# Patient Record
Sex: Female | Born: 1935 | Race: White | Hispanic: No | Smoking: Former smoker
Health system: Southern US, Community
[De-identification: ages and names within clinical notes are randomized; demographics above are authoritative.]

## PROBLEM LIST (undated history)

## (undated) DIAGNOSIS — F419 Anxiety disorder, unspecified: Secondary | ICD-10-CM

## (undated) DIAGNOSIS — J449 Chronic obstructive pulmonary disease, unspecified: Secondary | ICD-10-CM

## (undated) DIAGNOSIS — M810 Age-related osteoporosis without current pathological fracture: Secondary | ICD-10-CM

## (undated) DIAGNOSIS — I10 Essential (primary) hypertension: Secondary | ICD-10-CM

## (undated) DIAGNOSIS — M1990 Unspecified osteoarthritis, unspecified site: Secondary | ICD-10-CM

## (undated) HISTORY — PX: REFRACTIVE SURGERY: SHX103

---

## 2006-10-17 ENCOUNTER — Emergency Department: Payer: Self-pay | Admitting: Emergency Medicine

## 2006-10-17 ENCOUNTER — Other Ambulatory Visit: Payer: Self-pay

## 2009-06-11 ENCOUNTER — Inpatient Hospital Stay: Payer: Self-pay | Admitting: Internal Medicine

## 2012-10-13 ENCOUNTER — Ambulatory Visit: Payer: Self-pay | Admitting: Ophthalmology

## 2016-01-21 ENCOUNTER — Emergency Department: Payer: Medicare Other

## 2016-01-21 ENCOUNTER — Encounter: Payer: Self-pay | Admitting: Emergency Medicine

## 2016-01-21 ENCOUNTER — Inpatient Hospital Stay
Admission: EM | Admit: 2016-01-21 | Discharge: 2016-01-25 | DRG: 871 | Disposition: A | Discharge status: Hospice | Payer: Medicare Other | Attending: Specialist | Admitting: Specialist

## 2016-01-21 ENCOUNTER — Emergency Department
Admission: EM | Admit: 2016-01-21 | Discharge: 2016-01-21 | Disposition: A | Discharge status: Home / Self Care | Payer: Self-pay

## 2016-01-21 DIAGNOSIS — A419 Sepsis, unspecified organism: Secondary | ICD-10-CM | POA: Diagnosis present

## 2016-01-21 DIAGNOSIS — I10 Essential (primary) hypertension: Secondary | ICD-10-CM | POA: Diagnosis present

## 2016-01-21 DIAGNOSIS — F419 Anxiety disorder, unspecified: Secondary | ICD-10-CM | POA: Diagnosis present

## 2016-01-21 DIAGNOSIS — N179 Acute kidney failure, unspecified: Secondary | ICD-10-CM | POA: Diagnosis present

## 2016-01-21 DIAGNOSIS — G9341 Metabolic encephalopathy: Secondary | ICD-10-CM | POA: Diagnosis present

## 2016-01-21 DIAGNOSIS — Z515 Encounter for palliative care: Secondary | ICD-10-CM | POA: Diagnosis not present

## 2016-01-21 DIAGNOSIS — Z66 Do not resuscitate: Secondary | ICD-10-CM | POA: Diagnosis present

## 2016-01-21 DIAGNOSIS — A4151 Sepsis due to Escherichia coli [E. coli]: Secondary | ICD-10-CM | POA: Diagnosis not present

## 2016-01-21 DIAGNOSIS — J440 Chronic obstructive pulmonary disease with acute lower respiratory infection: Secondary | ICD-10-CM | POA: Diagnosis present

## 2016-01-21 DIAGNOSIS — I959 Hypotension, unspecified: Secondary | ICD-10-CM | POA: Diagnosis present

## 2016-01-21 DIAGNOSIS — B9620 Unspecified Escherichia coli [E. coli] as the cause of diseases classified elsewhere: Secondary | ICD-10-CM | POA: Diagnosis present

## 2016-01-21 DIAGNOSIS — N184 Chronic kidney disease, stage 4 (severe): Secondary | ICD-10-CM | POA: Diagnosis present

## 2016-01-21 DIAGNOSIS — Z7189 Other specified counseling: Secondary | ICD-10-CM

## 2016-01-21 DIAGNOSIS — J9602 Acute respiratory failure with hypercapnia: Secondary | ICD-10-CM | POA: Diagnosis present

## 2016-01-21 DIAGNOSIS — N390 Urinary tract infection, site not specified: Secondary | ICD-10-CM | POA: Diagnosis present

## 2016-01-21 DIAGNOSIS — J449 Chronic obstructive pulmonary disease, unspecified: Secondary | ICD-10-CM | POA: Insufficient documentation

## 2016-01-21 DIAGNOSIS — R4182 Altered mental status, unspecified: Secondary | ICD-10-CM | POA: Diagnosis present

## 2016-01-21 DIAGNOSIS — A413 Sepsis due to Hemophilus influenzae: Secondary | ICD-10-CM | POA: Diagnosis not present

## 2016-01-21 DIAGNOSIS — J9621 Acute and chronic respiratory failure with hypoxia: Secondary | ICD-10-CM | POA: Diagnosis not present

## 2016-01-21 DIAGNOSIS — Z681 Body mass index (BMI) 19 or less, adult: Secondary | ICD-10-CM

## 2016-01-21 DIAGNOSIS — R0600 Dyspnea, unspecified: Secondary | ICD-10-CM

## 2016-01-21 DIAGNOSIS — Z87891 Personal history of nicotine dependence: Secondary | ICD-10-CM

## 2016-01-21 DIAGNOSIS — R627 Adult failure to thrive: Secondary | ICD-10-CM | POA: Diagnosis present

## 2016-01-21 DIAGNOSIS — J9601 Acute respiratory failure with hypoxia: Secondary | ICD-10-CM | POA: Diagnosis present

## 2016-01-21 DIAGNOSIS — E872 Acidosis: Secondary | ICD-10-CM | POA: Diagnosis present

## 2016-01-21 DIAGNOSIS — Z5321 Procedure and treatment not carried out due to patient leaving prior to being seen by health care provider: Secondary | ICD-10-CM

## 2016-01-21 DIAGNOSIS — Z79899 Other long term (current) drug therapy: Secondary | ICD-10-CM | POA: Diagnosis not present

## 2016-01-21 DIAGNOSIS — J156 Pneumonia due to other aerobic Gram-negative bacteria: Secondary | ICD-10-CM | POA: Diagnosis present

## 2016-01-21 DIAGNOSIS — I248 Other forms of acute ischemic heart disease: Secondary | ICD-10-CM | POA: Diagnosis present

## 2016-01-21 DIAGNOSIS — I129 Hypertensive chronic kidney disease with stage 1 through stage 4 chronic kidney disease, or unspecified chronic kidney disease: Secondary | ICD-10-CM | POA: Diagnosis present

## 2016-01-21 DIAGNOSIS — R7989 Other specified abnormal findings of blood chemistry: Secondary | ICD-10-CM

## 2016-01-21 DIAGNOSIS — Z7982 Long term (current) use of aspirin: Secondary | ICD-10-CM

## 2016-01-21 DIAGNOSIS — E43 Unspecified severe protein-calorie malnutrition: Secondary | ICD-10-CM | POA: Diagnosis present

## 2016-01-21 DIAGNOSIS — J189 Pneumonia, unspecified organism: Secondary | ICD-10-CM | POA: Diagnosis not present

## 2016-01-21 DIAGNOSIS — E870 Hyperosmolality and hypernatremia: Secondary | ICD-10-CM | POA: Diagnosis present

## 2016-01-21 DIAGNOSIS — R5381 Other malaise: Secondary | ICD-10-CM | POA: Diagnosis present

## 2016-01-21 DIAGNOSIS — R778 Other specified abnormalities of plasma proteins: Secondary | ICD-10-CM | POA: Diagnosis present

## 2016-01-21 HISTORY — DX: Essential (primary) hypertension: I10

## 2016-01-21 HISTORY — DX: Age-related osteoporosis without current pathological fracture: M81.0

## 2016-01-21 HISTORY — DX: Unspecified osteoarthritis, unspecified site: M19.90

## 2016-01-21 HISTORY — DX: Chronic obstructive pulmonary disease, unspecified: J44.9

## 2016-01-21 HISTORY — DX: Anxiety disorder, unspecified: F41.9

## 2016-01-21 LAB — TROPONIN I: Troponin I: 0.07 ng/mL (ref <0.03)

## 2016-01-21 LAB — BRAIN NATRIURETIC PEPTIDE: B NATRIURETIC PEPTIDE 5: 242 pg/mL — AB (ref 0.0–100.0)

## 2016-01-21 LAB — CBC WITH DIFFERENTIAL/PLATELET
BASOS PCT: 0 %
Basophils Absolute: 0 10*3/uL (ref 0–0.1)
EOS ABS: 0 10*3/uL (ref 0–0.7)
EOS PCT: 0 %
HCT: 42.7 % (ref 35.0–47.0)
HEMOGLOBIN: 13.8 g/dL (ref 12.0–16.0)
LYMPHS ABS: 1 10*3/uL (ref 1.0–3.6)
Lymphocytes Relative: 7 %
MCH: 30.3 pg (ref 26.0–34.0)
MCHC: 32.3 g/dL (ref 32.0–36.0)
MCV: 93.9 fL (ref 80.0–100.0)
Monocytes Absolute: 1.3 10*3/uL — ABNORMAL HIGH (ref 0.2–0.9)
Monocytes Relative: 9 %
NEUTROS PCT: 84 %
Neutro Abs: 12 10*3/uL — ABNORMAL HIGH (ref 1.4–6.5)
PLATELETS: 220 10*3/uL (ref 150–440)
RBC: 4.55 MIL/uL (ref 3.80–5.20)
RDW: 13.6 % (ref 11.5–14.5)
WBC: 14.4 10*3/uL — AB (ref 3.6–11.0)

## 2016-01-21 LAB — COMPREHENSIVE METABOLIC PANEL
ALBUMIN: 3.2 g/dL — AB (ref 3.5–5.0)
ALT: 18 U/L (ref 14–54)
ANION GAP: 9 (ref 5–15)
AST: 31 U/L (ref 15–41)
Alkaline Phosphatase: 72 U/L (ref 38–126)
BUN: 74 mg/dL — ABNORMAL HIGH (ref 6–20)
CHLORIDE: 111 mmol/L (ref 101–111)
CO2: 30 mmol/L (ref 22–32)
Calcium: 9 mg/dL (ref 8.9–10.3)
Creatinine, Ser: 1.8 mg/dL — ABNORMAL HIGH (ref 0.44–1.00)
GFR calc Af Amer: 29 mL/min — ABNORMAL LOW (ref >60)
GFR calc non Af Amer: 25 mL/min — ABNORMAL LOW (ref >60)
GLUCOSE: 158 mg/dL — AB (ref 65–99)
Potassium: 3.5 mmol/L (ref 3.5–5.1)
SODIUM: 150 mmol/L — AB (ref 135–145)
Total Bilirubin: 0.5 mg/dL (ref 0.3–1.2)
Total Protein: 7 g/dL (ref 6.5–8.1)

## 2016-01-21 LAB — URINALYSIS, COMPLETE (UACMP) WITH MICROSCOPIC
BILIRUBIN URINE: NEGATIVE
Glucose, UA: NEGATIVE mg/dL
KETONES UR: NEGATIVE mg/dL
Leukocytes, UA: NEGATIVE
NITRITE: NEGATIVE
PROTEIN: 30 mg/dL — AB
RBC / HPF: NONE SEEN RBC/hpf (ref 0–5)
Specific Gravity, Urine: 1.015 (ref 1.005–1.030)
Squamous Epithelial / LPF: NONE SEEN
pH: 5 (ref 5.0–8.0)

## 2016-01-21 LAB — LACTIC ACID, PLASMA
LACTIC ACID, VENOUS: 2.8 mmol/L — AB (ref 0.5–1.9)
Lactic Acid, Venous: 1.1 mmol/L (ref 0.5–1.9)

## 2016-01-21 LAB — GLUCOSE, CAPILLARY: Glucose-Capillary: 140 mg/dL — ABNORMAL HIGH (ref 65–99)

## 2016-01-21 LAB — CK: Total CK: 38 U/L (ref 38–234)

## 2016-01-21 MED ORDER — LEVALBUTEROL HCL 1.25 MG/0.5ML IN NEBU
1.2500 mg | INHALATION_SOLUTION | Freq: Four times a day (QID) | RESPIRATORY_TRACT | Status: DC | PRN
  Administered 2016-01-21: 1.25 mg via RESPIRATORY_TRACT

## 2016-01-21 MED ORDER — MIRTAZAPINE 15 MG PO TABS: 15.0000 mg | ORAL_TABLET | Freq: Every day | ORAL | Status: DC

## 2016-01-21 MED ORDER — SODIUM CHLORIDE 0.9% FLUSH
3.0000 mL | Freq: Two times a day (BID) | INTRAVENOUS | Status: DC
  Administered 2016-01-21 – 2016-01-25 (×8): 3 mL via INTRAVENOUS

## 2016-01-21 MED ORDER — DEXTROSE 5 % IV SOLN
500.0000 mg | Freq: Once | INTRAVENOUS | Status: AC
  Administered 2016-01-21: 500 mg via INTRAVENOUS
  Filled 2016-01-21: qty 500

## 2016-01-21 MED ORDER — ONDANSETRON HCL 4 MG PO TABS: 4.0000 mg | ORAL_TABLET | Freq: Four times a day (QID) | ORAL | Status: DC | PRN

## 2016-01-21 MED ORDER — LATANOPROST 0.005 % OP SOLN
1.0000 [drp] | Freq: Every day | OPHTHALMIC | Status: DC
  Administered 2016-01-21 – 2016-01-23 (×2): 1 [drp] via OPHTHALMIC
  Filled 2016-01-21 (×2): qty 2.5

## 2016-01-21 MED ORDER — HEPARIN SODIUM (PORCINE) 5000 UNIT/ML IJ SOLN
5000.0000 [IU] | Freq: Three times a day (TID) | INTRAMUSCULAR | Status: DC
  Administered 2016-01-21 – 2016-01-24 (×8): 5000 [IU] via SUBCUTANEOUS
  Filled 2016-01-21 (×8): qty 1

## 2016-01-21 MED ORDER — DEXTROSE 5 % IV SOLN
500.0000 mg | INTRAVENOUS | Status: DC
  Administered 2016-01-22 – 2016-01-23 (×2): 500 mg via INTRAVENOUS
  Filled 2016-01-21 (×3): qty 500

## 2016-01-21 MED ORDER — ONDANSETRON HCL 4 MG/2ML IJ SOLN: 4.0000 mg | Freq: Four times a day (QID) | INTRAMUSCULAR | Status: DC | PRN

## 2016-01-21 MED ORDER — INFLUENZA VAC SPLIT QUAD 0.5 ML IM SUSY: 0.5000 mL | PREFILLED_SYRINGE | INTRAMUSCULAR | Status: DC

## 2016-01-21 MED ORDER — ACETAMINOPHEN 650 MG RE SUPP: 650.0000 mg | Freq: Four times a day (QID) | RECTAL | Status: DC | PRN

## 2016-01-21 MED ORDER — ACETAMINOPHEN 325 MG PO TABS: 650.0000 mg | ORAL_TABLET | Freq: Four times a day (QID) | ORAL | Status: DC | PRN

## 2016-01-21 MED ORDER — CEFTRIAXONE SODIUM-DEXTROSE 1-3.74 GM-% IV SOLR
1.0000 g | Freq: Once | INTRAVENOUS | Status: AC
  Administered 2016-01-21: 1 g via INTRAVENOUS
  Filled 2016-01-21: qty 50

## 2016-01-21 MED ORDER — SODIUM CHLORIDE 0.9 % IV BOLUS (SEPSIS)
1000.0000 mL | Freq: Once | INTRAVENOUS | Status: AC
  Administered 2016-01-21: 1000 mL via INTRAVENOUS

## 2016-01-21 MED ORDER — SERTRALINE HCL 50 MG PO TABS
25.0000 mg | ORAL_TABLET | Freq: Every day | ORAL | Status: DC
  Administered 2016-01-22: 25 mg via ORAL
  Filled 2016-01-21: qty 1

## 2016-01-21 MED ORDER — SODIUM CHLORIDE 0.9 % IV BOLUS (SEPSIS)
500.0000 mL | Freq: Once | INTRAVENOUS | Status: AC
  Administered 2016-01-21: 500 mL via INTRAVENOUS

## 2016-01-21 MED ORDER — LEVALBUTEROL HCL 1.25 MG/0.5ML IN NEBU
INHALATION_SOLUTION | RESPIRATORY_TRACT | Status: AC
  Administered 2016-01-21: 1.25 mg via RESPIRATORY_TRACT
  Filled 2016-01-21: qty 0.5

## 2016-01-21 MED ORDER — CEFTRIAXONE SODIUM-DEXTROSE 1-3.74 GM-% IV SOLR
1.0000 g | INTRAVENOUS | Status: DC
  Administered 2016-01-22: 1 g via INTRAVENOUS
  Filled 2016-01-21 (×2): qty 50

## 2016-01-21 MED ORDER — SODIUM CHLORIDE 0.9 % IV SOLN
INTRAVENOUS | Status: DC
  Administered 2016-01-21: via INTRAVENOUS

## 2016-01-21 MED ORDER — ASPIRIN EC 81 MG PO TBEC
81.0000 mg | DELAYED_RELEASE_TABLET | Freq: Every day | ORAL | Status: DC
  Administered 2016-01-22: 81 mg via ORAL
  Filled 2016-01-21: qty 1

## 2016-01-21 MED ORDER — CEFTRIAXONE SODIUM 1 G IJ SOLR: 1.0000 g | Freq: Once | INTRAMUSCULAR | Status: DC

## 2016-01-21 MED ORDER — CARVEDILOL 3.125 MG PO TABS
3.1250 mg | ORAL_TABLET | Freq: Two times a day (BID) | ORAL | Status: DC
  Administered 2016-01-22 (×2): 3.125 mg via ORAL
  Filled 2016-01-21 (×2): qty 1

## 2016-01-21 NOTE — H&P (Signed)
[facility] NAME: Tracy Jackson    MR#:  [id number]  DATE OF BIRTH:  May 16, 1929  DATE OF ADMISSION:  01/21/2016  PRIMARY CARE PHYSICIAN: Kirk Ruths., MD   REQUESTING/REFERRING PHYSICIAN: Archie Balboa, MD  CHIEF COMPLAINT:   Chief Complaint  Patient presents with  . Altered Mental Status    HISTORY OF PRESENT ILLNESS:  Tracy Jackson  is a 83 y.o. female who presents with Weakness and altered mental status. Patient is currently asleep and difficult to wake following up in order to participate in history of present illness, history of present illness is given by her daughter who is at bedside. Daughter states that the patient is normally high functioning at baseline, making her own meals and living alone. However, over this past week she has been laying in bed much more, and family went to see her after they felt like she was not doing well for a day or so and they found her in bed in her own stool unable to get up. Here in the hospital she was found to have pneumonia and met sepsis criteria. Hospitalists were called for admission  PAST MEDICAL HISTORY:   Past Medical History:  Diagnosis Date  . Anxiety   . COPD (chronic obstructive pulmonary disease) ([place])   . Hypertension   . Osteoarthritis   . Osteoporosis     PAST SURGICAL HISTORY:   Past Surgical History:  Procedure Laterality Date  . REFRACTIVE SURGERY      SOCIAL HISTORY:   Social History  Substance Use Topics  . Smoking status: Former Research scientist (life sciences)  . Smokeless tobacco: Never Used  . Alcohol use No    FAMILY HISTORY:  No family history on file.  DRUG ALLERGIES:  No Known Allergies  MEDICATIONS AT HOME:   Prior to Admission medications   Medication Sig Start Date End Date Taking? Authorizing Provider  aspirin EC 81 MG tablet Take 81 mg by mouth daily.   Yes Historical Provider, MD  carvedilol (COREG) 3.125 MG tablet Take 3.125 mg by mouth 2 (two)  times daily with a meal.   Yes Historical Provider, MD  latanoprost (XALATAN) 0.005 % ophthalmic solution Place 1 drop into both eyes at bedtime.   Yes Historical Provider, MD  mirtazapine (REMERON) 15 MG tablet Take 15 mg by mouth at bedtime.   Yes Historical Provider, MD  sertraline (ZOLOFT) 25 MG tablet Take 25 mg by mouth daily.   Yes Historical Provider, MD    REVIEW OF SYSTEMS:  Review of Systems  Unable to perform ROS: Acuity of condition     VITAL SIGNS:   Vitals:   01/21/16 1914 01/21/16 1928 01/21/16 1930 01/21/16 2123  BP:   (!) 157/98 120/63  Pulse: (!) 113   (!) 102  Resp:   (!) 24 (!) 40  Temp: 98.1 F (36.7 C) 97.9 F (36.6 C)    TempSrc: Axillary Rectal    SpO2: (!) 88%   100%  Weight:       Wt Readings from Last 3 Encounters:  01/21/16 47.7 kg (105 lb 2.6 oz)    PHYSICAL EXAMINATION:  Physical Exam  Vitals reviewed. Constitutional: She appears well-developed and well-nourished. No distress.  HENT:  Head: Normocephalic and atraumatic.  Mouth/Throat: Oropharynx is clear and moist.  Eyes: Conjunctivae and EOM are normal. Pupils are equal, round, and reactive to light. No scleral icterus.  Neck: Normal range of motion. Neck supple. No JVD present. No  thyromegaly present.  Cardiovascular: Regular rhythm and intact distal pulses.  Exam reveals no gallop and no friction rub.   No murmur heard. Borderline tachycardic  Respiratory: Effort normal. No respiratory distress. She has no wheezes. She has no rales.  left mid lobe rhonchi  GI: Soft. Bowel sounds are normal. She exhibits no distension. There is no tenderness.  Musculoskeletal: Normal range of motion. She exhibits no edema.  No arthritis, no gout  Lymphadenopathy:    She has no cervical adenopathy.  Neurological:  Unable to assess due to patient condition  Skin: Skin is warm and dry. No rash noted. No erythema.  Psychiatric:  Unable to assess due to patient condition    LABORATORY PANEL:    CBC  Recent Labs Lab 01/21/16 1910  WBC 14.4*  HGB 13.8  HCT 42.7  PLT 220   ------------------------------------------------------------------------------------------------------------------  Chemistries   Recent Labs Lab 01/21/16 1910  NA 150*  K 3.5  CL 111  CO2 30  GLUCOSE 158*  BUN 74*  CREATININE 1.80*  CALCIUM 9.0  AST 31  ALT 18  ALKPHOS 72  BILITOT 0.5   ------------------------------------------------------------------------------------------------------------------  Cardiac Enzymes  Recent Labs Lab 01/21/16 1910  TROPONINI 0.07*   ------------------------------------------------------------------------------------------------------------------  RADIOLOGY:  Ct Head Wo Contrast  Result Date: 01/21/2016 CLINICAL DATA:  Altered mental status. EXAM: CT HEAD WITHOUT CONTRAST TECHNIQUE: Contiguous axial images were obtained from the base of the skull through the vertex without intravenous contrast. COMPARISON:  None. FINDINGS: BRAIN: There is sulcal and ventricular prominence consistent with superficial and central atrophy. No intraparenchymal hemorrhage, is a moderate degree of periventricular and subcortical white matter hypodensity consistent with chronic small vessel ischemic disease. Tiny bilateral basal ganglial and left caudate lacunar infarcts likely given atherosclerotic disease, but in the absence of priors, are age indeterminate. No acute large vascular territory infarcts. No abnormal extra-axial fluid collections. Basal cisterns are patent. VASCULAR: Mild-to-moderate calcific atherosclerosis of the carotid siphons. SKULL: No skull fracture. No significant scalp soft tissue swelling. SINUSES/ORBITS: The mastoid air-cells are clear. The included paranasal sinuses demonstrate mild ethmoid and moderate sphenoid sinus mucosal thickening with mucous noted on the left. The maxillary sinuses are clear as is the frontal sinus.The included ocular globes and  orbital contents are non-suspicious. Right ocular lens surgery. OTHER: None. IMPRESSION: Age indeterminate in the absence of priors, but likely remote, is a moderate degree of periventricular and subcortical white matter small vessel ischemia. Bilateral tiny basal ganglial and left caudate lacunar infarcts also likely chronic. Electronically Signed   By: Ashley Royalty M.D.   On: 01/21/2016 20:18   Dg Chest Port 1 View  Result Date: 01/21/2016 CLINICAL DATA:  Altered mental status. EXAM: PORTABLE CHEST 1 VIEW COMPARISON:  Jun 12, 2009 FINDINGS: The heart size and mediastinal contours are stable. The heart size is enlarged. Patchy opacity is identified in the left upper and mid lung. The lungs are hyperinflated. The visualized skeletal structures are stable. IMPRESSION: Patchy opacity of the left upper mid lung suspicious for pneumonia. COPD. Cardiomegaly. Electronically Signed   By: Abelardo Diesel M.D.   On: 01/21/2016 20:28    EKG:   Orders placed or performed during the hospital encounter of 01/21/16  . EKG 12-Lead  . EKG 12-Lead    IMPRESSION AND PLAN:  Principal Problem:   Sepsis ([place]) - IV antibiotics started in the ED and continued on admission, lactic acid was elevated, fluid resuscitation given and lactic acid will be trended until within normal limits, patient  is hemodynamically stable, cultures sent Active Problems:   CAP (community acquired pneumonia) - IV antibiotics and cultures as above   Elevated troponin - patient does not have cardiac history, we'll trend her troponins tonight, get an echocardiogram in the morning   HTN (hypertension) - continue home meds, currently stable   Anxiety - home dose anxiolytics  All the records are reviewed and case discussed with ED provider. Management plans discussed with the patient and/or family.  DVT PROPHYLAXIS: SubQ heparin  GI PROPHYLAXIS: None  ADMISSION STATUS: Inpatient  CODE STATUS: Full Code Status History    This patient does  not have a recorded code status. Please follow your organizational policy for patients in this situation.    Advance Directive Documentation   Flowsheet Row Most Recent Value  Type of Advance Directive  Out of facility DNR (pink MOST or yellow form)  Pre-existing out of facility DNR order (yellow form or pink MOST form)  Yellow form placed in chart (order not valid for inpatient use)  "MOST" Form in Place?  No data      TOTAL TIME TAKING CARE OF THIS PATIENT: 45 minutes.    Kaslyn Richburg Luckey 01/21/2016, 9:32 PM  [facility]  Office  [phone]  CC: Primary care physician; Kirk Ruths., MD

## 2016-01-21 NOTE — ED Notes (Signed)
Per MD waiting on chest xr results for antibiotics

## 2016-01-21 NOTE — Progress Notes (Signed)
Pharmacy Antibiotic Note  Tracy Jackson is a 83 y.o. female admitted on 01/21/2016 with  pneumonia.  Pharmacy has been consulted for Ceftriaxone dosing. Patient is also receiving azithromycin daily.  Plan: Will start patient on ceftriaxone 1gm IV every 24 hours.   Weight: 105 lb 2.6 oz (47.7 kg)  Temp (24hrs), Avg:97.5 F (36.4 C), Min:96.5 F (35.8 C), Max:98.1 F (36.7 C)   Recent Labs Lab 01/21/16 1910  WBC 14.4*  CREATININE 1.80*  LATICACIDVEN 2.8*    CrCl cannot be calculated (Unknown ideal weight.).    No Known Allergies  Antimicrobials this admission: 1/1 ceftriaxone >>  1/1 azithromycin >>  Dose adjustments this admission:   Microbiology results: 1/1 BCx:  1/1 UCx:   Thank you for allowing pharmacy to be a part of this patient's care.  Gardner CandleSheema M Bernard Donahoo, PharmD, BCPS Clinical Pharmacist 01/21/2016 11:14 PM

## 2016-01-21 NOTE — ED Triage Notes (Signed)
Pt to ED via EMS from home, pt lives alone, c/o AMS and FTT per family. Per family pt was last seen normal x5-6days ago. Pt is usually independent and A&Ox4. Per EMS pt HR 100, afebrile. Pt alert at this time

## 2016-01-21 NOTE — ED Notes (Addendum)
Pt placed on non rebreather at this time, pt 95% sats

## 2016-01-21 NOTE — ED Provider Notes (Signed)
[facility] Emergency Department Provider Note   ____________________________________________   I have reviewed the triage vital signs and the nursing notes.   HISTORY  Chief Complaint Altered Mental Status   History limited by: Altered Mental Status   HPI Tracy Jackson is a 83 y.o. female who presents to the emergency department today via EMS because of concerns from family for failure to thrive. Per EMS the family last saw the patient roughly 6 days ago. When one of the daughters was talking to her on the phone however she noticed that something was wrong. She is in the 3 hours to see the patient. Patient was on the bed. It is unclear how long the patient is been lying on her bed. Live by herself at home. Per EMS the patient was lying in her own excrement. The patient states that she feels okay. She is not sure why EMS was called.   Past Medical History:  Diagnosis Date  . Anxiety   . COPD (chronic obstructive pulmonary disease) ([facility])   . Hypertension   . Osteoarthritis   . Osteoporosis     Patient Active Problem List   Diagnosis Date Noted  . Sepsis ([facility]) 01/21/2016  . CAP (community acquired pneumonia) 01/21/2016  . HTN (hypertension) 01/21/2016  . COPD (chronic obstructive pulmonary disease) ([facility]) 01/21/2016  . Anxiety 01/21/2016    Past Surgical History:  Procedure Laterality Date  . REFRACTIVE SURGERY      Prior to Admission medications   Not on File    Allergies Patient has no known allergies.  No family history on file.  Social History Social History  Substance Use Topics  . Smoking status: Former Games developermoker  . Smokeless tobacco: Never Used  . Alcohol use No    Review of Systems Unable to obtain secondary to altered mental status.  ____________________________________________   PHYSICAL EXAM:  VITAL SIGNS: ED Triage Vitals  Enc Vitals Group     BP 156/79     Pulse 113     Resp 16     Temp 98.1     Temp src     SpO2 88     Weight     Constitutional: Awake, alert. Not completely oriented. Chronically ill appearing. Eyes: Conjunctivae are normal. Normal extraocular movements. ENT   Head: Normocephalic and atraumatic.   Nose: No congestion/rhinnorhea.   Mouth/Throat: Mucous membranes are moist.   Neck: No stridor. Hematological/Lymphatic/Immunilogical: No cervical lymphadenopathy. Cardiovascular: Tachycardic, regular rhythm.  No murmurs, rubs, or gallops.  Respiratory: Slightly increased respiratory effort. Some rhonchi noted in the bases.  Gastrointestinal: Soft and non tender. No rebound. No guarding.  Genitourinary: Deferred Musculoskeletal: Normal range of motion in all extremities. No lower extremity edema. Neurologic:  Awake and alert. Not completely oriented to events. Skin:  Skin is warm, dry and intact. No rash noted. ____________________________________________    LABS (pertinent positives/negatives)  Labs Reviewed  CBC WITH DIFFERENTIAL/PLATELET - Abnormal; Notable for the following:       Result Value   WBC 14.4 (*)    Neutro Abs 12.0 (*)    Monocytes Absolute 1.3 (*)    All other components within normal limits  COMPREHENSIVE METABOLIC PANEL - Abnormal; Notable for the following:    Sodium 150 (*)    Glucose, Bld 158 (*)    BUN 74 (*)    Creatinine, Ser 1.80 (*)    Albumin 3.2 (*)    GFR calc non Af Amer 25 (*)  GFR calc Af Amer 29 (*)    All other components within normal limits  TROPONIN I - Abnormal; Notable for the following:    Troponin I 0.07 (*)    All other components within normal limits  URINALYSIS, COMPLETE (UACMP) WITH MICROSCOPIC - Abnormal; Notable for the following:    Color, Urine YELLOW (*)    APPearance CLOUDY (*)    Hgb urine dipstick SMALL (*)    Protein, ur 30 (*)    Bacteria, UA MANY (*)    All other components within normal limits  LACTIC ACID, PLASMA - Abnormal; Notable for the following:    Lactic Acid, Venous 2.8 (*)     All other components within normal limits  GLUCOSE, CAPILLARY - Abnormal; Notable for the following:    Glucose-Capillary 140 (*)    All other components within normal limits  CULTURE, BLOOD (ROUTINE X 2)  CULTURE, BLOOD (ROUTINE X 2)  URINE CULTURE  LACTIC ACID, PLASMA  CK     ____________________________________________   EKG  I, Phineas Semen, attending physician, personally viewed and interpreted this EKG  EKG Time: 1916 Rate: 115 Rhythm: sinus tachycardia Axis: normal Intervals: qtc 419 QRS: narrow ST changes: no st elevation Impression: abnormal ekg   ____________________________________________    RADIOLOGY  CXR  IMPRESSION: Patchy opacity of the left upper mid lung suspicious for pneumonia. COPD. Cardiomegaly.  CT head  CLINICAL DATA: Altered mental status.  EXAM: CT HEAD WITHOUT CONTRAST  TECHNIQUE: Contiguous axial images were obtained from the base of the skull through the vertex without intravenous contrast.  COMPARISON: None.  FINDINGS: BRAIN: There is sulcal and ventricular prominence consistent with superficial and central atrophy. No intraparenchymal hemorrhage, is a moderate degree of periventricular and subcortical white matter hypodensity consistent with chronic small vessel ischemic disease. Tiny bilateral basal ganglial and left caudate lacunar infarcts likely given atherosclerotic disease, but in the absence of priors, are age indeterminate. No acute large vascular territory infarcts. No abnormal extra-axial fluid collections. Basal cisterns are patent.  VASCULAR: Mild-to-moderate calcific atherosclerosis of the carotid siphons.  SKULL: No skull fracture. No significant scalp soft tissue swelling.  SINUSES/ORBITS: The mastoid air-cells are clear. The included paranasal sinuses demonstrate mild ethmoid and moderate sphenoid sinus mucosal thickening with mucous noted on the left. The maxillary sinuses are clear as is the  frontal sinus.The included ocular globes and orbital contents are non-suspicious. Right ocular lens surgery.  OTHER: None.  IMPRESSION: Age indeterminate in the absence of priors, but likely remote, is a moderate degree of periventricular and subcortical white matter small vessel ischemia.  Bilateral tiny basal ganglial and left caudate lacunar infarcts also likely chronic.      ____________________________________________   PROCEDURES  Procedures  ____________________________________________   INITIAL IMPRESSION / ASSESSMENT AND PLAN / ED COURSE  Pertinent labs & imaging results that were available during my care of the patient were reviewed by me and considered in my medical decision making (see chart for details).  Patient presented to the emergency department today via EMS because of concerns for failure to thrive and possible infection. On exam patient is somewhat chronically ill-appearing. She was tachycardic however afebrile. Given her presentation lactic and blood cultures were ordered. Did have some concerns that patient might be septic.  Blood work has returned and shows an elevated white count, troponin and I take acidosis. Additionally chest x-ray is concerning for pneumonia. At this point I think likely patient suffering from sepsis secondary to pneumonia. Patient's heart rate  did improve somewhat after some IV fluids. ____________________________________________   FINAL CLINICAL IMPRESSION(S) / ED DIAGNOSES  Final diagnoses:  Community acquired pneumonia, unspecified laterality  Sepsis, due to unspecified organism [facility])     Note: This dictation was prepared with Office manager. Any transcriptional errors that result from this process are unintentional     Phineas Semen, MD 01/21/16 2122

## 2016-01-21 NOTE — ED Notes (Signed)
Family at bedside. 

## 2016-01-21 NOTE — ED Notes (Signed)
Patient transported to CT 

## 2016-01-22 ENCOUNTER — Inpatient Hospital Stay (HOSPITAL_COMMUNITY)
Admit: 2016-01-22 | Discharge: 2016-01-22 | Disposition: A | Discharge status: Home / Self Care | Payer: Medicare Other | Attending: Internal Medicine | Admitting: Internal Medicine

## 2016-01-22 DIAGNOSIS — I10 Essential (primary) hypertension: Secondary | ICD-10-CM

## 2016-01-22 LAB — CBC
HCT: 40.9 % (ref 35.0–47.0)
Hemoglobin: 12.6 g/dL (ref 12.0–16.0)
MCH: 30.5 pg (ref 26.0–34.0)
MCHC: 30.8 g/dL — AB (ref 32.0–36.0)
MCV: 98.8 fL (ref 80.0–100.0)
PLATELETS: 156 10*3/uL (ref 150–440)
RBC: 4.14 MIL/uL (ref 3.80–5.20)
RDW: 15 % — AB (ref 11.5–14.5)
WBC: 12.2 10*3/uL — ABNORMAL HIGH (ref 3.6–11.0)

## 2016-01-22 LAB — BASIC METABOLIC PANEL
Anion gap: 11 (ref 5–15)
Anion gap: 8 (ref 5–15)
BUN: 75 mg/dL — ABNORMAL HIGH (ref 6–20)
BUN: 82 mg/dL — ABNORMAL HIGH (ref 6–20)
CALCIUM: 8.6 mg/dL — AB (ref 8.9–10.3)
CALCIUM: 8.1 mg/dL — AB (ref 8.9–10.3)
CHLORIDE: 122 mmol/L — AB (ref 101–111)
CO2: 22 mmol/L (ref 22–32)
CO2: 22 mmol/L (ref 22–32)
CREATININE: 1.93 mg/dL — AB (ref 0.44–1.00)
Chloride: 124 mmol/L — ABNORMAL HIGH (ref 101–111)
Creatinine, Ser: 2.14 mg/dL — ABNORMAL HIGH (ref 0.44–1.00)
GFR calc non Af Amer: 23 mL/min — ABNORMAL LOW (ref >60)
GFR calc non Af Amer: 20 mL/min — ABNORMAL LOW (ref >60)
GFR, EST AFRICAN AMERICAN: 26 mL/min — AB (ref >60)
GFR, EST AFRICAN AMERICAN: 23 mL/min — AB (ref >60)
GLUCOSE: 123 mg/dL — AB (ref 65–99)
GLUCOSE: 161 mg/dL — AB (ref 65–99)
POTASSIUM: 3.9 mmol/L (ref 3.5–5.1)
Potassium: 4.3 mmol/L (ref 3.5–5.1)
Sodium: 155 mmol/L — ABNORMAL HIGH (ref 135–145)
Sodium: 154 mmol/L — ABNORMAL HIGH (ref 135–145)

## 2016-01-22 LAB — BLOOD GAS, ARTERIAL
Acid-base deficit: 3.2 mmol/L — ABNORMAL HIGH (ref 0.0–2.0)
Bicarbonate: 24.9 mmol/L (ref 20.0–28.0)
DELIVERY SYSTEMS: POSITIVE
EXPIRATORY PAP: 10
FIO2: 0.4
INSPIRATORY PAP: 20
Mechanical Rate: 14
O2 Saturation: 94 %
PCO2 ART: 58 mmHg — AB (ref 32.0–48.0)
PH ART: 7.24 — AB (ref 7.350–7.450)
Patient temperature: 37
pO2, Arterial: 83 mmHg (ref 83.0–108.0)

## 2016-01-22 LAB — TROPONIN I
TROPONIN I: 0.21 ng/mL — AB (ref <0.03)
Troponin I: 0.05 ng/mL (ref <0.03)

## 2016-01-22 LAB — ECHOCARDIOGRAM COMPLETE
Height: 64 in
WEIGHTICAEL: 1199.3 [oz_av]

## 2016-01-22 LAB — MRSA PCR SCREENING: MRSA BY PCR: NEGATIVE

## 2016-01-22 LAB — GLUCOSE, CAPILLARY: Glucose-Capillary: 160 mg/dL — ABNORMAL HIGH (ref 65–99)

## 2016-01-22 MED ORDER — LORAZEPAM 2 MG/ML IJ SOLN
1.0000 mg | INTRAMUSCULAR | Status: DC | PRN
  Administered 2016-01-22: 1 mg via INTRAVENOUS
  Administered 2016-01-23: 0.25 mg via INTRAVENOUS
  Filled 2016-01-22 (×2): qty 1

## 2016-01-22 MED ORDER — DEXTROSE 5 % IV SOLN
INTRAVENOUS | Status: AC
  Administered 2016-01-22 – 2016-01-23 (×2): via INTRAVENOUS

## 2016-01-22 MED ORDER — SODIUM CHLORIDE 0.9 % IV BOLUS (SEPSIS)
250.0000 mL | Freq: Once | INTRAVENOUS | Status: AC
  Administered 2016-01-22: 250 mL via INTRAVENOUS

## 2016-01-22 MED ORDER — DEXTROSE-NACL 5-0.45 % IV SOLN
INTRAVENOUS | Status: DC
  Administered 2016-01-22: 09:00:00 via INTRAVENOUS

## 2016-01-22 MED ORDER — HALOPERIDOL LACTATE 5 MG/ML IJ SOLN: 1.0000 mg | Freq: Four times a day (QID) | INTRAMUSCULAR | Status: DC | PRN

## 2016-01-22 MED ORDER — ORAL CARE MOUTH RINSE
15.0000 mL | Freq: Two times a day (BID) | OROMUCOSAL | Status: DC
  Administered 2016-01-22 – 2016-01-25 (×5): 15 mL via OROMUCOSAL

## 2016-01-22 MED ORDER — INFLUENZA VAC SPLIT QUAD 0.5 ML IM SUSY: 0.5000 mL | PREFILLED_SYRINGE | INTRAMUSCULAR | Status: DC | PRN

## 2016-01-22 MED ORDER — METHYLPREDNISOLONE SODIUM SUCC 40 MG IJ SOLR
40.0000 mg | Freq: Every day | INTRAMUSCULAR | Status: DC
  Administered 2016-01-23: 40 mg via INTRAVENOUS
  Filled 2016-01-22: qty 1

## 2016-01-22 MED ORDER — CHLORHEXIDINE GLUCONATE 0.12 % MT SOLN
15.0000 mL | Freq: Two times a day (BID) | OROMUCOSAL | Status: DC
  Administered 2016-01-22: 15 mL via OROMUCOSAL
  Filled 2016-01-22: qty 15

## 2016-01-22 MED ORDER — METHYLPREDNISOLONE SODIUM SUCC 40 MG IJ SOLR
40.0000 mg | Freq: Three times a day (TID) | INTRAMUSCULAR | Status: DC
  Administered 2016-01-22: 40 mg via INTRAVENOUS
  Filled 2016-01-22: qty 1

## 2016-01-22 NOTE — Progress Notes (Signed)
*  PRELIMINARY RESULTS* Echocardiogram 2D Echocardiogram has been performed.  Cristela BlueHege, Cherryl Babin 01/22/2016, 3:41 PM

## 2016-01-22 NOTE — Progress Notes (Signed)
[facility] [place] at [facility]   PATIENT NAME: Tracy Jackson    MR#:  [id number]  DATE OF BIRTH:  1930/01/18  SUBJECTIVE:  CHIEF COMPLAINT:   Chief Complaint  Patient presents with  . Altered Mental Status  Patient is a 83 year old Caucasian female with past medical history significant for history of COPD, anxiety, hypertension, osteomyelitis, osteoporosis, who presents to the hospital with complaints of altered mental status, weakness. On arrival to the hospital, she was noted to have pneumonia and was admitted. She is requiring BiPAP for oxygenation, remains tachycardic, to review systems due to altered mental status, confusion  Review of Systems  Unable to perform ROS: Critical illness    VITAL SIGNS: Blood pressure 124/68, pulse (!) 103, temperature 97.8 F (36.6 C), resp. rate (!) 31, height 5\' 4"  (1.626 m), weight 34 kg (74 lb 15.3 oz), SpO2 94 %.  PHYSICAL EXAMINATION:   GENERAL:  83 y.o.-year-old patient lying in the bed in moderate to severe respiratory distress, tachycardic, tachypneic, restless, trying to pull off BiPAP mask on her face.  EYES: Pupils equal, round, reactive to light and accommodation. No scleral icterus. Extraocular muscles intact.  HEENT: Head atraumatic, normocephalic. Oropharynx and nasopharynx clear.  NECK:  Supple, no jugular venous distention. No thyroid enlargement, no tenderness.  LUNGS: Somewhat diminished breath sounds bilaterally, no wheezing, bilateral diffuse scattered rales,rhonchi and crepitations. Using accessory muscles of respiration with breathing, uncomfortable, restless, struggling.  CARDIOVASCULAR: S1, S2 , tachycardic. No murmurs, rubs, or gallops, distant, difficult to evaluate due to significant of potential sounds from the lungs.  ABDOMEN: Soft, nontender, nondistended. Bowel sounds present. No organomegaly or mass.  EXTREMITIES: No pedal edema, cyanosis, or clubbing.  NEUROLOGIC: Cranial nerves II  through XII are intact. Muscle strength 5/5 in all extremities. Sensation intact. Gait not checked.  PSYCHIATRIC: The patient is somnolent, not able to review of systems, poorly responsive to stimulation, nonverbal, not able to assess orientation.  SKIN: No obvious rash, lesion, or ulcer.   ORDERS/RESULTS REVIEWED:   CBC  Recent Labs Lab 01/21/16 1910 01/22/16 0531  WBC 14.4* 12.2*  HGB 13.8 12.6  HCT 42.7 40.9  PLT 220 156  MCV 93.9 98.8  MCH 30.3 30.5  MCHC 32.3 30.8*  RDW 13.6 15.0*  LYMPHSABS 1.0  --   MONOABS 1.3*  --   EOSABS 0.0  --   BASOSABS 0.0  --    ------------------------------------------------------------------------------------------------------------------  Chemistries   Recent Labs Lab 01/21/16 1910 01/22/16 0531 01/22/16 1107  NA 150* 155* 154*  K 3.5 4.3 3.9  CL 111 122* 124*  CO2 30 22 22   GLUCOSE 158* 123* 161*  BUN 74* 75* 82*  CREATININE 1.80* 1.93* 2.14*  CALCIUM 9.0 8.6* 8.1*  AST 31  --   --   ALT 18  --   --   ALKPHOS 72  --   --   BILITOT 0.5  --   --    ------------------------------------------------------------------------------------------------------------------ estimated creatinine clearance is 10.5 mL/min (by C-G formula based on SCr of 2.14 mg/dL (H)). ------------------------------------------------------------------------------------------------------------------ No results for input(s): TSH, T4TOTAL, T3FREE, THYROIDAB in the last 72 hours.  Invalid input(s): FREET3  Cardiac Enzymes  Recent Labs Lab 01/21/16 1910 01/22/16 0531 01/22/16 1107  TROPONINI 0.07* 0.21* 0.05*   ------------------------------------------------------------------------------------------------------------------ Invalid input(s): POCBNP ---------------------------------------------------------------------------------------------------------------  RADIOLOGY: Ct Head Wo Contrast  Result Date: 01/21/2016 CLINICAL DATA:  Altered mental  status. EXAM: CT HEAD WITHOUT CONTRAST TECHNIQUE: Contiguous axial images were obtained from the base  of the skull through the vertex without intravenous contrast. COMPARISON:  None. FINDINGS: BRAIN: There is sulcal and ventricular prominence consistent with superficial and central atrophy. No intraparenchymal hemorrhage, is a moderate degree of periventricular and subcortical white matter hypodensity consistent with chronic small vessel ischemic disease. Tiny bilateral basal ganglial and left caudate lacunar infarcts likely given atherosclerotic disease, but in the absence of priors, are age indeterminate. No acute large vascular territory infarcts. No abnormal extra-axial fluid collections. Basal cisterns are patent. VASCULAR: Mild-to-moderate calcific atherosclerosis of the carotid siphons. SKULL: No skull fracture. No significant scalp soft tissue swelling. SINUSES/ORBITS: The mastoid air-cells are clear. The included paranasal sinuses demonstrate mild ethmoid and moderate sphenoid sinus mucosal thickening with mucous noted on the left. The maxillary sinuses are clear as is the frontal sinus.The included ocular globes and orbital contents are non-suspicious. Right ocular lens surgery. OTHER: None. IMPRESSION: Age indeterminate in the absence of priors, but likely remote, is a moderate degree of periventricular and subcortical white matter small vessel ischemia. Bilateral tiny basal ganglial and left caudate lacunar infarcts also likely chronic. Electronically Signed   By: Tollie Eth M.D.   On: 01/21/2016 20:18   Dg Chest Port 1 View  Result Date: 01/21/2016 CLINICAL DATA:  Altered mental status. EXAM: PORTABLE CHEST 1 VIEW COMPARISON:  Jun 12, 2009 FINDINGS: The heart size and mediastinal contours are stable. The heart size is enlarged. Patchy opacity is identified in the left upper and mid lung. The lungs are hyperinflated. The visualized skeletal structures are stable. IMPRESSION: Patchy opacity of the  left upper mid lung suspicious for pneumonia. COPD. Cardiomegaly. Electronically Signed   By: Sherian Rein M.D.   On: 01/21/2016 20:28    EKG:  Orders placed or performed during the hospital encounter of 01/21/16  . EKG 12-Lead  . EKG 12-Lead    ASSESSMENT AND PLAN:  Principal Problem:   Sepsis (HCC) Active Problems:   CAP (community acquired pneumonia)   HTN (hypertension)   Anxiety   Elevated troponin  #1. Acute respiratory failure with hypoxia and hypercapnia, due to pneumonia, continue BiPAP, oxygen as needed, ABGs showed some improvement in CO2 retention, patient is DO NOT RESUSCITATE per prior request, verified with patient's daughter #2. Sepsis due to pneumonia, patient received IV fluids, blood pressure has improved, decrease IV fluid rate , blood cultures are negative so far, MRSA, PCR is negative, unable to get sputum cultures #3, community acquired pneumonia, continue Rocephin and Zithromax, get sputum cultures if possible, primary consultation is requested, patient is DO NOT RESUSCITATE #4. Hypernatremia, discontinue normal saline, initiate D5 water solution, reassess sodium level later today and tomorrow morning #5. Elevated troponin, likely demand ischemia, unable to use beta blockers or calcium joint block was due to relative hypotension #6. Leukocytosis, improving with antibiotic therapy #7. Acute renal failure, follow creatinine and urinary output  in the morning, likely hypotension related  Management plans discussed with the patient, family and they are in agreement.   DRUG ALLERGIES: No Known Allergies  CODE STATUS:     Code Status Orders        Start     Ordered   01/21/16 2311  Do not attempt resuscitation (DNR)  Continuous    Question Answer Comment  In the event of cardiac or respiratory ARREST Do not call a "code blue"   In the event of cardiac or respiratory ARREST Do not perform Intubation, CPR, defibrillation or ACLS   In the event of cardiac  or  respiratory ARREST Use medication by any route, position, wound care, and other measures to relive pain and suffering. May use oxygen, suction and manual treatment of airway obstruction as needed for comfort.      01/21/16 2310    Code Status History    Date Active Date Inactive Code Status Order ID Comments User Context   This patient has a current code status but no historical code status.    Advance Directive Documentation   Flowsheet Row Most Recent Value  Type of Advance Directive  Healthcare Power of [place], Out of facility DNR (pink MOST or yellow form) Jalene Mullet[Sharryl Gilliam, daughter]  Pre-existing out of facility DNR order (yellow form or pink MOST form)  Yellow form placed in chart (order not valid for inpatient use)  "MOST" Form in Place?  No data      TOTAL Critical care TIME TAKING CARE OF THIS PATIENT: 45  minutes.    Katharina CaperVAICKUTE,Giovany Cosby M.D on 01/22/2016 at 3:55 PM  Between 7am to 6pm - Pager - [phone]  After 6pm go to [email or web address] - password [email or web address]  [facility]  Office  [phone]  CC: Primary care physician; Lauro RegulusANDERSON,MARSHALL W., MD

## 2016-01-22 NOTE — Care Management Note (Signed)
Case Management Note  Patient Details  Name: Tracy Jackson MRN: [id number] Date of Birth: 09/21/1929  Subjective/Objective:                  Spoke with patient's daughter [name] at [phone]. Patient is from home where she was living independently. She has no O2 at home or any ambulatory assistance devices. Her PCP is Dr. Einar CrowMarshall Anderson. Her daughter is interested in SNF at discharge. Patient has never needed home health services. Daughter states patient has not difficulty obtaining medication.   Action/Plan:   Explained RNCM role/CSW role in care. Explained that PT would be needed if MD/RN feels that patient would benefit from SNF or HHPT. RNCM will continue to follow.   Expected Discharge Date:                  Expected Discharge Plan:     In-House Referral:     Discharge planning Services  CM Consult  Post Acute Care Choice:  Home Health, Durable Medical Equipment Choice offered to:  Adult Children  DME Arranged:    DME Agency:     HH Arranged:    HH Agency:     Status of Service:  In process, will continue to follow  If discussed at Long Length of Stay Meetings, dates discussed:    Additional Comments:  Collie Siadngela Maliik Karner, RN 01/22/2016, 9:40 AM

## 2016-01-23 DIAGNOSIS — J9621 Acute and chronic respiratory failure with hypoxia: Secondary | ICD-10-CM

## 2016-01-23 DIAGNOSIS — J156 Pneumonia due to other aerobic Gram-negative bacteria: Secondary | ICD-10-CM

## 2016-01-23 DIAGNOSIS — Z515 Encounter for palliative care: Secondary | ICD-10-CM

## 2016-01-23 DIAGNOSIS — A419 Sepsis, unspecified organism: Principal | ICD-10-CM

## 2016-01-23 DIAGNOSIS — A413 Sepsis due to Hemophilus influenzae: Secondary | ICD-10-CM

## 2016-01-23 DIAGNOSIS — J189 Pneumonia, unspecified organism: Secondary | ICD-10-CM

## 2016-01-23 DIAGNOSIS — F419 Anxiety disorder, unspecified: Secondary | ICD-10-CM

## 2016-01-23 DIAGNOSIS — E43 Unspecified severe protein-calorie malnutrition: Secondary | ICD-10-CM | POA: Insufficient documentation

## 2016-01-23 DIAGNOSIS — J9622 Acute and chronic respiratory failure with hypercapnia: Secondary | ICD-10-CM

## 2016-01-23 DIAGNOSIS — Z7189 Other specified counseling: Secondary | ICD-10-CM

## 2016-01-23 LAB — BLOOD CULTURE ID PANEL (REFLEXED)
Acinetobacter baumannii: NOT DETECTED
Candida albicans: NOT DETECTED
Candida glabrata: NOT DETECTED
Candida krusei: NOT DETECTED
Candida parapsilosis: NOT DETECTED
Candida tropicalis: NOT DETECTED
ENTEROCOCCUS SPECIES: NOT DETECTED
Enterobacter cloacae complex: NOT DETECTED
Enterobacteriaceae species: NOT DETECTED
Escherichia coli: NOT DETECTED
HAEMOPHILUS INFLUENZAE: DETECTED — AB
Klebsiella oxytoca: NOT DETECTED
Klebsiella pneumoniae: NOT DETECTED
Listeria monocytogenes: NOT DETECTED
NEISSERIA MENINGITIDIS: NOT DETECTED
PROTEUS SPECIES: NOT DETECTED
PSEUDOMONAS AERUGINOSA: NOT DETECTED
SERRATIA MARCESCENS: NOT DETECTED
STAPHYLOCOCCUS AUREUS BCID: NOT DETECTED
STAPHYLOCOCCUS SPECIES: NOT DETECTED
STREPTOCOCCUS AGALACTIAE: NOT DETECTED
STREPTOCOCCUS PNEUMONIAE: NOT DETECTED
STREPTOCOCCUS SPECIES: NOT DETECTED
Streptococcus pyogenes: NOT DETECTED

## 2016-01-23 LAB — GLUCOSE, CAPILLARY
GLUCOSE-CAPILLARY: 144 mg/dL — AB (ref 65–99)
Glucose-Capillary: 163 mg/dL — ABNORMAL HIGH (ref 65–99)
Glucose-Capillary: 161 mg/dL — ABNORMAL HIGH (ref 65–99)

## 2016-01-23 LAB — BLOOD GAS, ARTERIAL
Acid-base deficit: 1.3 mmol/L (ref 0.0–2.0)
Bicarbonate: 28.8 mmol/L — ABNORMAL HIGH (ref 20.0–28.0)
FIO2: 0.4
O2 Saturation: 92.6 %
Patient temperature: 37
pCO2 arterial: 79 mmHg (ref 32.0–48.0)
pH, Arterial: 7.17 — CL (ref 7.350–7.450)
pO2, Arterial: 82 mmHg — ABNORMAL LOW (ref 83.0–108.0)

## 2016-01-23 LAB — BASIC METABOLIC PANEL
Anion gap: 5 (ref 5–15)
BUN: 81 mg/dL — AB (ref 6–20)
CO2: 28 mmol/L (ref 22–32)
Calcium: 8 mg/dL — ABNORMAL LOW (ref 8.9–10.3)
Chloride: 117 mmol/L — ABNORMAL HIGH (ref 101–111)
Creatinine, Ser: 2.05 mg/dL — ABNORMAL HIGH (ref 0.44–1.00)
GFR, EST AFRICAN AMERICAN: 24 mL/min — AB (ref >60)
GFR, EST NON AFRICAN AMERICAN: 21 mL/min — AB (ref >60)
Glucose, Bld: 208 mg/dL — ABNORMAL HIGH (ref 65–99)
Potassium: 3.6 mmol/L (ref 3.5–5.1)
SODIUM: 150 mmol/L — AB (ref 135–145)

## 2016-01-23 LAB — SODIUM
SODIUM: 148 mmol/L — AB (ref 135–145)
SODIUM: 146 mmol/L — AB (ref 135–145)
Sodium: 144 mmol/L (ref 135–145)

## 2016-01-23 MED ORDER — MEROPENEM 500 MG IV SOLR
500.0000 mg | Freq: Two times a day (BID) | INTRAVENOUS | Status: DC
  Administered 2016-01-23 – 2016-01-24 (×2): 500 mg via INTRAVENOUS
  Filled 2016-01-23 (×3): qty 0.5

## 2016-01-23 MED ORDER — INSULIN ASPART 100 UNIT/ML ~~LOC~~ SOLN
0.0000 [IU] | Freq: Three times a day (TID) | SUBCUTANEOUS | Status: DC
  Administered 2016-01-23: 1 [IU] via SUBCUTANEOUS
  Administered 2016-01-23: 3 [IU] via SUBCUTANEOUS
  Administered 2016-01-24: 1 [IU] via SUBCUTANEOUS
  Filled 2016-01-23: qty 3
  Filled 2016-01-23 (×2): qty 1

## 2016-01-23 MED ORDER — DEXTROSE-NACL 5-0.45 % IV SOLN
INTRAVENOUS | Status: DC
  Administered 2016-01-23: 18:00:00 via INTRAVENOUS

## 2016-01-23 MED ORDER — LORAZEPAM 2 MG/ML IJ SOLN: 0.2500 mg | INTRAMUSCULAR | Status: DC | PRN

## 2016-01-23 MED ORDER — ENSURE ENLIVE PO LIQD: 237.0000 mL | Freq: Two times a day (BID) | ORAL | Status: DC

## 2016-01-23 MED ORDER — MORPHINE SULFATE (PF) 4 MG/ML IV SOLN: 1.0000 mg | INTRAVENOUS | Status: DC | PRN

## 2016-01-23 NOTE — Consult Note (Signed)
Consultation Note Date: 01/23/2016   Patient Name: Tracy Jackson  DOB: 1929-08-04  MRN: [id number]  Age / Sex: 83 y.o., female  PCP: Kirk Ruths, MD Referring Physician: Theodoro Grist, MD  Reason for Consultation: Establishing goals of care for acute hypoxic respiratory failure and sepsis.  HPI/Patient Profile: 83 y.o. female  with past medical history of COPD, anxiety, and osteoporosis who was admitted on 01/21/2016 with acute hypoxic respiratory failure and altered mental status.  Initial work up revealed that Tracy Jackson was septic likely due to pneumonia.  Blood cultures has since revealed gram negative coccobacilli in 1 of 2 cultures and her urine culture is positive for e-coli.  Since admission she has remained lethargic and on Bipap in the ICU.  Clinical Assessment and Goals of Care: I met with the daughter and grand dtr at the patient's bedside.  Tracy Jackson was living at home independently.  She is a very religious christian woman and has been "ready to go home" for the past year according to her daughter Tracy Jackson.  We discussed her prognosis and results of recent labs which indicate an acid build up in her blood stream despite Bipap.  We discussed that Tracy Jackson is likely in the process of dying.  We discussed her agitation yesterday and comfort medications for both agitation and dyspnea.  Sherryll feels her mother would want to pass on - rather than have prolonged suffering.  She agreed to contact her siblings (brother and sister) in order to tell them to come visit and assist in making the decision of whether or not to withdraw care.    We talked about comfort medications, weaning the bipap and hospice services if she stabilizes.  We agreed to meet again tomorrow.   Primary Decision Maker:  NEXT OF KIN Dtr. [name]    SUMMARY OF RECOMMENDATIONS    Code Status/Advance Care  Planning:  DNR    Symptom Management:   Morphine PRN for dyspnea   Ativan 0.25 mg for agitation/anxiety  Palliative Prophylaxis:   Frequent Pain Assessment  Additional Recommendations (Limitations, Scope, Preferences):  The family does not want Mrs. Resh to be uncomfortable.  I anticipate that they will move forward with the withdraw of care on 1/4 or 1/5 after her other 2 children have a chance to visit.  Psycho-social/Spiritual:   Desire for further Chaplaincy support:yes - the patient's pastor is aware and will visit.  Additional Recommendations: Caregiving  Support/Resources  Prognosis:   Hours - Days  Discharge Planning: Anticipated Hospital Death vs hospice facility.      Primary Diagnoses: Present on Admission: . Sepsis ([place]) . CAP (community acquired pneumonia) . HTN (hypertension) . Anxiety . Elevated troponin   I have reviewed the medical record, interviewed the patient and family, and examined the patient. The following aspects are pertinent.  Past Medical History:  Diagnosis Date  . Anxiety   . COPD (chronic obstructive pulmonary disease) ([place])   . Hypertension   . Osteoarthritis   . Osteoporosis  Social History   Social History  . Marital status: Legally Separated    Spouse name: N/A  . Number of children: N/A  . Years of education: N/A   Social History Main Topics  . Smoking status: Former Research scientist (life sciences)  . Smokeless tobacco: Never Used  . Alcohol use No  . Drug use: No  . Sexual activity: Not Asked   Other Topics Concern  . None   Social History Narrative  . None   No family history on file. Scheduled Meds: . aspirin EC  81 mg Oral Daily  . azithromycin  500 mg Intravenous Q24H  . carvedilol  3.125 mg Oral BID WC  . cefTRIAXone  1 g Intravenous Q24H  . chlorhexidine  15 mL Mouth Rinse BID  . feeding supplement (ENSURE ENLIVE)  237 mL Oral BID BM  . heparin  5,000 Units Subcutaneous Q8H  . insulin aspart  0-9 Units  Subcutaneous TID WC  . latanoprost  1 drop Both Eyes QHS  . mouth rinse  15 mL Mouth Rinse q12n4p  . methylPREDNISolone (SOLU-MEDROL) injection  40 mg Intravenous Daily  . mirtazapine  15 mg Oral QHS  . sertraline  25 mg Oral Daily  . sodium chloride flush  3 mL Intravenous Q12H   Continuous Infusions: . dextrose 75 mL/hr at 01/23/16 0933   PRN Meds:.acetaminophen **OR** acetaminophen, haloperidol lactate, Influenza vac split quadrivalent PF, levalbuterol, LORazepam, morphine injection, ondansetron **OR** ondansetron (ZOFRAN) IV No Known Allergies Review of Systems patient on bipap  Physical Exam  Constitutional:  Frail elderly female on bipap.  Occasionally pulling at her mask.  HENT:  Head: Normocephalic and atraumatic.  Eyes:  Closed.  Neck: Neck supple. No thyromegaly present.  Cardiovascular: Normal rate and regular rhythm.   Murmur heard. Pulmonary/Chest: Effort normal.  On Bipap  Abdominal: Soft. She exhibits no distension.  Musculoskeletal: She exhibits no edema or deformity.  Neurological:  Asleep.  Did not wake to exam or voice.  Skin: Skin is warm and dry.  Psychiatric:  Unable to assess.  Nursing note and vitals reviewed.    Vital Signs: BP (!) 87/55   Pulse 76   Temp 98.1 F (36.7 C) (Axillary)   Resp (!) 27   Ht 5' 4"  (1.626 m)   Wt 34 kg (74 lb 15.3 oz)   SpO2 92%   BMI 12.87 kg/m  Pain Assessment: CPOT   Pain Score: 0-No pain   SpO2: SpO2: 92 % O2 Device:SpO2: 92 % O2 Flow Rate: .O2 Flow Rate (L/min): 8 L/min (40%)  IO: Intake/output summary:  Intake/Output Summary (Last 24 hours) at 01/23/16 1558 Last data filed at 01/23/16 1100  Gross per 24 hour  Intake          1319.17 ml  Output                0 ml  Net          1319.17 ml    LBM: Last BM Date: 01/22/16 (smear) Baseline Weight: Weight: 47.7 kg (105 lb 2.6 oz) Most recent weight: Weight: 34 kg (74 lb 15.3 oz)     Palliative Assessment/Data:   Flowsheet Rows   Flowsheet Row  Most Recent Value  Intake Tab  Referral Department  Critical care  Unit at Time of Referral  ICU  Palliative Care Primary Diagnosis  Sepsis/Infectious Disease  Date Notified  01/23/16  Palliative Care Type  New Palliative care  Reason for referral  Clarify Goals of Care  Date of Admission  01/21/16  Date first seen by Palliative Care  01/23/16  # of days Palliative referral response time  0 Day(s)  # of days IP prior to Palliative referral  2  Clinical Assessment  Palliative Performance Scale Score  10%  Psychosocial & Spiritual Assessment  Palliative Care Outcomes  Patient/Family meeting held?  Yes  Who was at the meeting?  patient, daughter, grand dtr  Palliative Care Outcomes  Clarified goals of care, Improved non-pain symptom therapy, Counseled regarding hospice      Time In: 3:15 Time Out: 4:12 Time Total: 57 min. Greater than 50%  of this time was spent counseling and coordinating care related to the above assessment and plan.  Signed by: Imogene Burn, PA-C Palliative Medicine Pager: [phone]  Please contact Palliative Medicine Team phone at [phone] for questions and concerns.  For individual provider: See Shea Evans

## 2016-01-23 NOTE — Progress Notes (Signed)
[facility] [place] at [facility]   PATIENT NAME: Tracy LovelessLillian Jackson    MR#:  [id number]  DATE OF BIRTH:  05/06/1929  SUBJECTIVE:  CHIEF COMPLAINT:   Chief Complaint  Patient presents with  . Altered Mental Status  Patient is a 83 year old Caucasian female with past medical history significant for history of COPD, anxiety, hypertension, osteomyelitis, osteoporosis, who presents to the hospital with complaints of altered mental status, weakness. On arrival to the hospital, she was noted to have pneumonia and was admitted. She continues to require BiPAP for oxygenation, occasionally is pulling BiPAP mask off. She was initiated on morphine and Haldol, appears to be somnolent and more comfortable on the mask today. Not able to review systems. Patient was evaluated by palliative care and pulmonologist, recommended to continue current therapy, possibly transition to palliative care only if not able to wean off BiPAP within next few days.   Review of Systems  Unable to perform ROS: Critical illness    VITAL SIGNS: Blood pressure (!) 87/55, pulse 76, temperature 98.1 F (36.7 C), temperature source Axillary, resp. rate (!) 27, height 5\' 4"  (1.626 m), weight 34 kg (74 lb 15.3 oz), SpO2 92 %.  PHYSICAL EXAMINATION:   GENERAL:  83 y.o.-year-old patient lying in the bed in moderate respiratory distress, somnolent, trying to pull off BiPAP mask on her face intermittently.  EYES: Pupils equal, round, reactive to light and accommodation. No scleral icterus. Extraocular muscles intact. Poorly responsive HEENT: Head atraumatic, normocephalic. Oropharynx and nasopharynx clear. BiPAP mask is on the face NECK:  Supple, no jugular venous distention. No thyroid enlargement, no tenderness.  LUNGS: Diminished breath sounds bilaterally, no wheezing, bilateral scattered rhonchi . Using accessory muscles of respiration with breathing, more comfortable and sleepy today.  CARDIOVASCULAR: S1, S2  , rate was regular. No murmurs, rubs, or gallops  ABDOMEN: Soft, nontender, nondistended. Bowel sounds present. No organomegaly or mass.  EXTREMITIES: No pedal edema, cyanosis, or clubbing.  NEUROLOGIC: Cranial nerves II through XII are intact. Muscle strength 5/5 in all extremities. Sensation intact. Gait not checked.  PSYCHIATRIC: The patient is somnolent, not able to review of systems, poorly responsive to stimulation, nonverbal, not able to assess orientation.  SKIN: No obvious rash, lesion, or ulcer.   ORDERS/RESULTS REVIEWED:   CBC  Recent Labs Lab 01/21/16 1910 01/22/16 0531  WBC 14.4* 12.2*  HGB 13.8 12.6  HCT 42.7 40.9  PLT 220 156  MCV 93.9 98.8  MCH 30.3 30.5  MCHC 32.3 30.8*  RDW 13.6 15.0*  LYMPHSABS 1.0  --   MONOABS 1.3*  --   EOSABS 0.0  --   BASOSABS 0.0  --    ------------------------------------------------------------------------------------------------------------------  Chemistries   Recent Labs Lab 01/21/16 1910 01/22/16 0531 01/22/16 1107 01/23/16 0318 01/23/16 0900 01/23/16 1511  NA 150* 155* 154* 150* 148* 144  K 3.5 4.3 3.9 3.6  --   --   CL 111 122* 124* 117*  --   --   CO2 30 22 22 28   --   --   GLUCOSE 158* 123* 161* 208*  --   --   BUN 74* 75* 82* 81*  --   --   CREATININE 1.80* 1.93* 2.14* 2.05*  --   --   CALCIUM 9.0 8.6* 8.1* 8.0*  --   --   AST 31  --   --   --   --   --   ALT 18  --   --   --   --   --  ALKPHOS 72  --   --   --   --   --   BILITOT 0.5  --   --   --   --   --    ------------------------------------------------------------------------------------------------------------------ estimated creatinine clearance is 11 mL/min (by C-G formula based on SCr of 2.05 mg/dL (H)). ------------------------------------------------------------------------------------------------------------------ No results for input(s): TSH, T4TOTAL, T3FREE, THYROIDAB in the last 72 hours.  Invalid input(s): FREET3  Cardiac  Enzymes  Recent Labs Lab 01/21/16 1910 01/22/16 0531 01/22/16 1107  TROPONINI 0.07* 0.21* 0.05*   ------------------------------------------------------------------------------------------------------------------ Invalid input(s): POCBNP ---------------------------------------------------------------------------------------------------------------  RADIOLOGY: Ct Head Wo Contrast  Result Date: 01/21/2016 CLINICAL DATA:  Altered mental status. EXAM: CT HEAD WITHOUT CONTRAST TECHNIQUE: Contiguous axial images were obtained from the base of the skull through the vertex without intravenous contrast. COMPARISON:  None. FINDINGS: BRAIN: There is sulcal and ventricular prominence consistent with superficial and central atrophy. No intraparenchymal hemorrhage, is a moderate degree of periventricular and subcortical white matter hypodensity consistent with chronic small vessel ischemic disease. Tiny bilateral basal ganglial and left caudate lacunar infarcts likely given atherosclerotic disease, but in the absence of priors, are age indeterminate. No acute large vascular territory infarcts. No abnormal extra-axial fluid collections. Basal cisterns are patent. VASCULAR: Mild-to-moderate calcific atherosclerosis of the carotid siphons. SKULL: No skull fracture. No significant scalp soft tissue swelling. SINUSES/ORBITS: The mastoid air-cells are clear. The included paranasal sinuses demonstrate mild ethmoid and moderate sphenoid sinus mucosal thickening with mucous noted on the left. The maxillary sinuses are clear as is the frontal sinus.The included ocular globes and orbital contents are non-suspicious. Right ocular lens surgery. OTHER: None. IMPRESSION: Age indeterminate in the absence of priors, but likely remote, is a moderate degree of periventricular and subcortical white matter small vessel ischemia. Bilateral tiny basal ganglial and left caudate lacunar infarcts also likely chronic. Electronically Signed    By: Tollie Eth M.D.   On: 01/21/2016 20:18   Dg Chest Port 1 View  Result Date: 01/21/2016 CLINICAL DATA:  Altered mental status. EXAM: PORTABLE CHEST 1 VIEW COMPARISON:  Jun 12, 2009 FINDINGS: The heart size and mediastinal contours are stable. The heart size is enlarged. Patchy opacity is identified in the left upper and mid lung. The lungs are hyperinflated. The visualized skeletal structures are stable. IMPRESSION: Patchy opacity of the left upper mid lung suspicious for pneumonia. COPD. Cardiomegaly. Electronically Signed   By: Sherian Rein M.D.   On: 01/21/2016 20:28    EKG:  Orders placed or performed during the hospital encounter of 01/21/16  . EKG 12-Lead  . EKG 12-Lead    ASSESSMENT AND PLAN:  Principal Problem:   Sepsis (HCC) Active Problems:   CAP (community acquired pneumonia)   HTN (hypertension)   Anxiety   Elevated troponin   Protein-calorie malnutrition, severe   Goals of care, counseling/discussion   Palliative care encounter   Gram-negative pneumonia (HCC)  #1. Acute respiratory failure with hypoxia and hypercapnia, due to pneumonia, continue BiPAP, oxygen as needed, ABGs showed no improvement in CO2 retention, patient is DO NOT RESUSCITATE per prior request, possible transition to palliative care/hospice care within 24 to 48 hours, if no significant improvement #2. Sepsis due to gram-negative coccobacilli, Haemophilus influenzae due to pneumonia, patient received IV fluids, blood pressure has stabilized, now on decreased IV fluid rate , blood cultures is positive for gram-negative coccobacilli , MRSA, PCR is negative, unable to get sputum cultures, urine culture is also positive for Escherichia coli. Change antibiotic to meropenem to cover  possibly resistant Escherichia coli.  #3, community acquired pneumonia, continue meropenem and Zithromax, unable to get sputum cultures , appreciate pulmonary input,  patient is DO NOT RESUSCITATE #4. Hypernatremia, resolved on D5  water solution, reassess sodium level tomorrow morning #5. Elevated troponin, likely demand ischemia, unable to use beta blockers due to hypotension #6. Leukocytosis, improving with antibiotic therapy, recheck tomorrow morning #7. Acute renal failure, follow creatinine and urinary output  in the morning, likely hypotension related, initiate patient on IV fluids, follow creatinine in the morning #8. Urinary tract infection due to Escherichia coli, initiate patient on meropenem, waiting for sensitivities #9. Metabolic encephalopathy, supportive therapy, aspiration precautions  Management plans discussed with the patient, family and they are in agreement.   DRUG ALLERGIES: No Known Allergies  CODE STATUS:     Code Status Orders        Start     Ordered   01/21/16 2311  Do not attempt resuscitation (DNR)  Continuous    Question Answer Comment  In the event of cardiac or respiratory ARREST Do not call a "code blue"   In the event of cardiac or respiratory ARREST Do not perform Intubation, CPR, defibrillation or ACLS   In the event of cardiac or respiratory ARREST Use medication by any route, position, wound care, and other measures to relive pain and suffering. May use oxygen, suction and manual treatment of airway obstruction as needed for comfort.      01/21/16 2310    Code Status History    Date Active Date Inactive Code Status Order ID Comments User Context   This patient has a current code status but no historical code status.    Advance Directive Documentation   Flowsheet Row Most Recent Value  Type of Advance Directive  Healthcare Power of [place], Out of facility DNR (pink MOST or yellow form) Jalene [place], daughter]  Pre-existing out of facility DNR order (yellow form or pink MOST form)  Yellow form placed in chart (order not valid for inpatient use)  "MOST" Form in Place?  No data      TOTAL Critical care TIME TAKING CARE OF THIS PATIENT: 40  minutes.   Discussed  with Dr. Rush Barer M.D on 01/23/2016 at 5:02 PM  Between 7am to 6pm - Pager - [phone]  After 6pm go to [email or web address] - password [email or web address] [facility]  [facility]  Office  [phone]  CC: Primary care physician; Lauro Regulus., MD

## 2016-01-23 NOTE — Progress Notes (Signed)
PHARMACY - PHYSICIAN COMMUNICATION CRITICAL VALUE ALERT - BLOOD CULTURE IDENTIFICATION (BCID)  Results for orders placed or performed during the hospital encounter of 01/21/16  Blood Culture ID Panel (Reflexed) (Collected: 01/21/2016  7:10 PM)  Result Value Ref Range   Enterococcus species NOT DETECTED NOT DETECTED   Listeria monocytogenes NOT DETECTED NOT DETECTED   Staphylococcus species NOT DETECTED NOT DETECTED   Staphylococcus aureus NOT DETECTED NOT DETECTED   Streptococcus species NOT DETECTED NOT DETECTED   Streptococcus agalactiae NOT DETECTED NOT DETECTED   Streptococcus pneumoniae NOT DETECTED NOT DETECTED   Streptococcus pyogenes NOT DETECTED NOT DETECTED   Acinetobacter baumannii NOT DETECTED NOT DETECTED   Enterobacteriaceae species NOT DETECTED NOT DETECTED   Enterobacter cloacae complex NOT DETECTED NOT DETECTED   Escherichia coli NOT DETECTED NOT DETECTED   Klebsiella oxytoca NOT DETECTED NOT DETECTED   Klebsiella pneumoniae NOT DETECTED NOT DETECTED   Proteus species NOT DETECTED NOT DETECTED   Serratia marcescens NOT DETECTED NOT DETECTED   Haemophilus influenzae DETECTED (A) NOT DETECTED   Neisseria meningitidis NOT DETECTED NOT DETECTED   Pseudomonas aeruginosa NOT DETECTED NOT DETECTED   Candida albicans NOT DETECTED NOT DETECTED   Candida glabrata NOT DETECTED NOT DETECTED   Candida krusei NOT DETECTED NOT DETECTED   Candida parapsilosis NOT DETECTED NOT DETECTED   Candida tropicalis NOT DETECTED NOT DETECTED    Name of physician (or Provider) Contacted: Willis  Changes to prescribed antibiotics required: None  Carola FrostNathan A Tyquasia Pant, Pharm.D., BCPS Clinical Pharmacist 01/23/2016  2:12 AM

## 2016-01-23 NOTE — Consult Note (Signed)
[facility] Consultation     ASSESSMENT/PLAN    PULMONARY A: Acute on chronic respiratory acidosis, ABG 7.17/99/82/28.8. This is improved from previous, but still life-threatening, and uncompensated. View chest x-ray images: Emphysematous changes with left-sided pneumonia/atelectasis. P:   --Continued severe respiratory failure, despite BiPAP support, and appropriate antibiotic treatment of her pneumonia. -Continue current antibiotics, BiPAP as tolerated. -Given lack of progress, advanced age, comorbidities, poor chance for meaningful recovery. We should consider transition to comfort measures if the patient remained BiPAP dependent.  CARDIOVASCULAR A: Echo 01/22/16: EF 65% P:  --  RENAL A:  Acute kidney injury on chronic disease. P:   --  GASTROINTESTINAL A:  Advance diet when tolerated. P:   --  HEMATOLOGIC A:  -- P:  --  INFECTIOUS A:  Pneumonia with sepsis, suspected due to Haemophilus influenza.Marland Kitchen P:    Micro/culture results:  BCx2 1/1: . Gram-negative coccobacilli UC one/one: Negative to date Sputum--- MRSA PCR is, 1/2:  Antibiotics: Azithromycin 1/2>> Ceftriaxone 1/2>>  ENDOCRINE A:  Monitor blood sugars P:   --  NEUROLOGIC A: Acute metabolic encephalopathy secondary to sepsis. P:     MAJOR EVENTS/TEST RESULTS:   Best Practices  DVT Prophylaxis: Heparin subcutaneous GI Prophylaxis: --   ---------------------------------------  ---------------------------------------   Name: Tracy Jackson MRN: [id number] DOB: 08-Sep-1929    ADMISSION DATE:  01/21/2016 CONSULTATION DATE:  01/23/15  REFERRING MD :  Dr. Ether Griffins  CHIEF COMPLAINT:  Dyspnea.    HISTORY OF PRESENT ILLNESS:   The patient is an 83 year old female, currently she is on BiPAP and lethargic, therefore, all history is obtained from the chart and from staff. patient is normally high functioning at baseline, making her own meals and living alone.  However, over this past week she has been laying in bed much more, and family went to see her after they felt like she was not doing well for a day or so and they found her in bed in her own stool unable to get up. Here in the hospital she was found to have pneumonia and met sepsis criteria. Hospitalists were called for admission. Since her admission to the intensive care unit, she is been very lethargic, and minimally responsive. She is required BiPAP around the clock, her initial arterial blood gas showed severe respiratory acidosis, which has improved mildly with use of BiPAP. Due to her small size of the face. We have been having difficulty obtaining a proper seal with BiPAP with frequent leaks.  PAST MEDICAL HISTORY :  Past Medical History:  Diagnosis Date  . Anxiety   . COPD (chronic obstructive pulmonary disease) ([facility])   . Hypertension   . Osteoarthritis   . Osteoporosis    Past Surgical History:  Procedure Laterality Date  . REFRACTIVE SURGERY     Prior to Admission medications   Medication Sig Start Date End Date Taking? Authorizing Provider  aspirin EC 81 MG tablet Take 81 mg by mouth daily.   Yes Historical Provider, MD  carvedilol (COREG) 3.125 MG tablet Take 3.125 mg by mouth 2 (two) times daily with a meal.   Yes Historical Provider, MD  latanoprost (XALATAN) 0.005 % ophthalmic solution Place 1 drop into both eyes at bedtime.   Yes Historical Provider, MD  mirtazapine (REMERON) 15 MG tablet Take 15 mg by mouth at bedtime.   Yes Historical Provider, MD  sertraline (ZOLOFT) 25 MG tablet Take 25 mg by mouth daily.   Yes Historical Provider, MD   No  Known Allergies  FAMILY HISTORY:  No family history on file. SOCIAL HISTORY:  reports that she has quit smoking. She has never used smokeless tobacco. She reports that she does not drink alcohol or use drugs.  REVIEW OF SYSTEMS:   Constitutional: Feels well. Cardiovascular: No chest pain.  Pulmonary: Denies dyspnea.   The  remainder of systems were reviewed and were found to be negative other than what is documented in the HPI.    VITAL SIGNS: Temp:  [97.6 F (36.4 C)-98.4 F (36.9 C)] 97.6 F (36.4 C) (01/03 0200) Pulse Rate:  [55-110] 90 (01/03 0700) Resp:  [14-39] 16 (01/03 0700) BP: (80-137)/(28-93) 89/54 (01/03 0700) SpO2:  [90 %-100 %] 94 % (01/03 0700) FiO2 (%):  [40 %] 40 % (01/03 0500) HEMODYNAMICS:   VENTILATOR SETTINGS: FiO2 (%):  [40 %] 40 % INTAKE / OUTPUT:  Intake/Output Summary (Last 24 hours) at 01/23/16 0934 Last data filed at 01/23/16 0700  Gross per 24 hour  Intake          1569.17 ml  Output                0 ml  Net          1569.17 ml    Physical Examination:   VS: BP (!) 89/54   Pulse 90   Temp 97.6 F (36.4 C) (Axillary)   Resp 16   Ht 5' 4" (1.626 m)   Wt 74 lb 15.3 oz (34 kg)   SpO2 94%   BMI 12.87 kg/m   General Appearance: No distress  Neuro:without focal findings, mental status, speech , Reduced HEENT: PERRLA, EOM intact, no ptosis, no other lesions noticed;  Pulmonary: Decreased air entry bilaterally CardiovascularNormal S1,S2.  No m/r/g.    Abdomen: Benign, Soft, non-tender, No masses, hepatosplenomegaly, No lymphadenopathy Renal:  No costovertebral tenderness  GU:  Not performed at this time. Endoc: No evident thyromegaly, no signs of acromegaly. Skin:   warm, no rashes, no ecchymosis  Extremities: normal, no cyanosis, clubbing, no edema, warm with normal capillary refill.    LABS: Reviewed   LABORATORY PANEL:   CBC  Recent Labs Lab 01/22/16 0531  WBC 12.2*  HGB 12.6  HCT 40.9  PLT 156    Chemistries   Recent Labs Lab 01/21/16 1910  01/23/16 0318  NA 150*  < > 150*  K 3.5  < > 3.6  CL 111  < > 117*  CO2 30  < > 28  GLUCOSE 158*  < > 208*  BUN 74*  < > 81*  CREATININE 1.80*  < > 2.05*  CALCIUM 9.0  < > 8.0*  AST 31  --   --   ALT 18  --   --   ALKPHOS 72  --   --   BILITOT 0.5  --   --   < > = values in this interval  not displayed.   Recent Labs Lab 01/21/16 1910 01/22/16 0212  GLUCAP 140* 160*    Recent Labs Lab 01/22/16 0044 01/22/16 0501 01/23/16 0415  PHART 7.02* 7.24* 7.17*  PCO2ART 120* 58* 79*  PO2ART 264* 83 82*    Recent Labs Lab 01/21/16 1910  AST 31  ALT 18  ALKPHOS 72  BILITOT 0.5  ALBUMIN 3.2*    Cardiac Enzymes  Recent Labs Lab 01/22/16 1107  TROPONINI 0.05*    RADIOLOGY:  Ct Head Wo Contrast  Result Date: 01/21/2016 CLINICAL DATA:  Altered mental status. EXAM: CT  HEAD WITHOUT CONTRAST TECHNIQUE: Contiguous axial images were obtained from the base of the skull through the vertex without intravenous contrast. COMPARISON:  None. FINDINGS: BRAIN: There is sulcal and ventricular prominence consistent with superficial and central atrophy. No intraparenchymal hemorrhage, is a moderate degree of periventricular and subcortical white matter hypodensity consistent with chronic small vessel ischemic disease. Tiny bilateral basal ganglial and left caudate lacunar infarcts likely given atherosclerotic disease, but in the absence of priors, are age indeterminate. No acute large vascular territory infarcts. No abnormal extra-axial fluid collections. Basal cisterns are patent. VASCULAR: Mild-to-moderate calcific atherosclerosis of the carotid siphons. SKULL: No skull fracture. No significant scalp soft tissue swelling. SINUSES/ORBITS: The mastoid air-cells are clear. The included paranasal sinuses demonstrate mild ethmoid and moderate sphenoid sinus mucosal thickening with mucous noted on the left. The maxillary sinuses are clear as is the frontal sinus.The included ocular globes and orbital contents are non-suspicious. Right ocular lens surgery. OTHER: None. IMPRESSION: Age indeterminate in the absence of priors, but likely remote, is a moderate degree of periventricular and subcortical white matter small vessel ischemia. Bilateral tiny basal ganglial and left caudate lacunar infarcts also  likely chronic. Electronically Signed   By: Ashley Royalty M.D.   On: 01/21/2016 20:18   Dg Chest Port 1 View  Result Date: 01/21/2016 CLINICAL DATA:  Altered mental status. EXAM: PORTABLE CHEST 1 VIEW COMPARISON:  Jun 12, 2009 FINDINGS: The heart size and mediastinal contours are stable. The heart size is enlarged. Patchy opacity is identified in the left upper and mid lung. The lungs are hyperinflated. The visualized skeletal structures are stable. IMPRESSION: Patchy opacity of the left upper mid lung suspicious for pneumonia. COPD. Cardiomegaly. Electronically Signed   By: Abelardo Diesel M.D.   On: 01/21/2016 20:28       --Marda Stalker, MD.  Board Certified in Internal Medicine, Pulmonary Medicine, [place], and Sleep Medicine.  ICU Pager [phone] [facility] Office Number: [phone]  Patricia Pesa, M.D.  Vilinda Boehringer, M.D.  Merton Border, M.D   01/23/2016, 9:34 AM   Critical Care Attestation.  I have personally obtained a history, examined the patient, evaluated laboratory and imaging results, formulated the assessment and plan and placed orders. The Patient requires high complexity decision making for assessment and support, frequent evaluation and titration of therapies, application of advanced monitoring technologies and extensive interpretation of multiple databases. The patient has critical illness that could lead imminently to failure of 1 or more organ systems and requires the highest level of physician preparedness to intervene.  Critical Care Time devoted to patient care services described in this note is 45 minutes and is exclusive of time spent in procedures.

## 2016-01-23 NOTE — Progress Notes (Signed)
Pharmacy Antibiotic Note  Tracy Jackson is a 83 y.o. female with a h/o COPD admitted on 01/21/2016 with  pneumonia.  Pharmacy has been consulted for ceftriaxone dosing. Patient is also receiving azithromycin daily.  BCx with H influenzae in 1/2 and UCx with E coli  Plan: Will continue ceftriaxone 1gm IV every 24 hours.   Will f/u culture/sensitivities.   Height: 5\' 4"  (162.6 cm) Weight: 74 lb 15.3 oz (34 kg) IBW/kg (Calculated) : 54.7  Temp (24hrs), Avg:98 F (36.7 C), Min:97.6 F (36.4 C), Max:98.4 F (36.9 C)   Recent Labs Lab 01/21/16 1910 01/21/16 2229 01/22/16 0531 01/22/16 1107 01/23/16 0318  WBC 14.4*  --  12.2*  --   --   CREATININE 1.80*  --  1.93* 2.14* 2.05*  LATICACIDVEN 2.8* 1.1  --   --   --     Estimated Creatinine Clearance: 11 mL/min (by C-G formula based on SCr of 2.05 mg/dL (H)).    No Known Allergies  Antimicrobials this admission: 1/1 ceftriaxone >>  1/1 azithromycin >>  Dose adjustments this admission:   Microbiology results: 1/1 BCx: H influenzae 1/2 1/1 UCx: E coli 1/2 MRSA PCR: negative  Thank you for allowing pharmacy to be a part of this patient's care.  [name], [name], PharmD Clinical Pharmacist 01/23/2016 4:09 PM

## 2016-01-23 NOTE — Progress Notes (Signed)
Initial Nutrition Assessment  DOCUMENTATION CODES:   Severe malnutrition in context of chronic illness, Underweight  INTERVENTION:  -Pt not appropriate for po intake at present; pt would benefit from Dysphagia III diet when able to take po, diet modified so that available when pt able to take po -Pt takes Boost/Ensure at home; recommend addition of Ensure Enlive po BID, each supplement provides 350 kcal and 20 grams of protein. Discussed ordering today so that it will be available if/when pt able to safely take po   NUTRITION DIAGNOSIS:   Malnutrition related to chronic illness as evidenced by severe depletion of body fat, severe depletion of muscle mass, energy intake < or equal to 75% for > or equal to 1 month.  GOAL:   Patient will meet greater than or equal to 90% of their needs  MONITOR:   PO intake, Supplement acceptance, Labs, Weight trends  REASON FOR ASSESSMENT:   Malnutrition Screening Tool    ASSESSMENT:    83 yo female admitted with acute on chronic respiratory failure, acute on CKD  Pt lethargic, on BiPap. Unable to safely remove BiPap at present for po intake. Daughter at bedside. Reports appetite has been "good" for patient but only eats small amounts. Daughter described golf-ball sized meal portions. Pt also drinks Boost/Ensure but takes pt 3 days to drink 1 whole shake.  Daughter reports wt loss; 10 pounds in 6 months (11.9% wt loss). Pt is underweight.   Pt is edentulous, has dentures but not wearing at present.   Nutrition-Focused physical exam completed. Findings are severe fat depletion, severe muscle depletion, and no edema.   Labs: sodium 148, Creatinine 2.05 Meds: D5 at 75 ml/hr, solumedrol, ss novolog  Diet Order:  Heart Healthy  Skin:  Reviewed, no issues  Last BM:  01/22/16 +smear  Height:   Ht Readings from Last 1 Encounters:  01/22/16 5\' 4"  (1.626 m)    Weight:   Wt Readings from Last 1 Encounters:  01/22/16 74 lb 15.3 oz (34 kg)     BMI:  Body mass index is 12.87 kg/m.  Estimated Nutritional Needs:   Kcal:  1200-1500 kcals  Protein:  60-75 g  Fluid:  >/= 1.5 L  EDUCATION NEEDS:   No education needs identified at this time Romelle StarcherCate Onalee Steinbach MS, RD, LDN [phone] Pager  [phone] Weekend/On-Call Pager

## 2016-01-24 ENCOUNTER — Inpatient Hospital Stay: Payer: Medicare Other

## 2016-01-24 DIAGNOSIS — A4151 Sepsis due to Escherichia coli [E. coli]: Secondary | ICD-10-CM

## 2016-01-24 DIAGNOSIS — Z7189 Other specified counseling: Secondary | ICD-10-CM

## 2016-01-24 DIAGNOSIS — Z515 Encounter for palliative care: Secondary | ICD-10-CM

## 2016-01-24 DIAGNOSIS — J189 Pneumonia, unspecified organism: Secondary | ICD-10-CM

## 2016-01-24 LAB — BLOOD GAS, ARTERIAL
ACID-BASE EXCESS: 2 mmol/L (ref 0.0–2.0)
BICARBONATE: 29.8 mmol/L — AB (ref 20.0–28.0)
FIO2: 0.36
O2 SAT: 94.3 %
PATIENT TEMPERATURE: 37
PO2 ART: 82 mmHg — AB (ref 83.0–108.0)
pCO2 arterial: 65 mmHg — ABNORMAL HIGH (ref 32.0–48.0)
pH, Arterial: 7.27 — ABNORMAL LOW (ref 7.350–7.450)

## 2016-01-24 LAB — CBC
HEMATOCRIT: 31.3 % — AB (ref 35.0–47.0)
Hemoglobin: 10.2 g/dL — ABNORMAL LOW (ref 12.0–16.0)
MCH: 30.5 pg (ref 26.0–34.0)
MCHC: 32.5 g/dL (ref 32.0–36.0)
MCV: 93.7 fL (ref 80.0–100.0)
Platelets: 116 10*3/uL — ABNORMAL LOW (ref 150–440)
RBC: 3.34 MIL/uL — ABNORMAL LOW (ref 3.80–5.20)
RDW: 13.9 % (ref 11.5–14.5)
WBC: 17.9 10*3/uL — AB (ref 3.6–11.0)

## 2016-01-24 LAB — BASIC METABOLIC PANEL
ANION GAP: 5 (ref 5–15)
BUN: 74 mg/dL — ABNORMAL HIGH (ref 6–20)
CALCIUM: 8.4 mg/dL — AB (ref 8.9–10.3)
CO2: 26 mmol/L (ref 22–32)
CREATININE: 1.89 mg/dL — AB (ref 0.44–1.00)
Chloride: 116 mmol/L — ABNORMAL HIGH (ref 101–111)
GFR, EST AFRICAN AMERICAN: 27 mL/min — AB (ref >60)
GFR, EST NON AFRICAN AMERICAN: 23 mL/min — AB (ref >60)
Glucose, Bld: 160 mg/dL — ABNORMAL HIGH (ref 65–99)
Potassium: 3.8 mmol/L (ref 3.5–5.1)
Sodium: 147 mmol/L — ABNORMAL HIGH (ref 135–145)

## 2016-01-24 LAB — URINE CULTURE

## 2016-01-24 LAB — GLUCOSE, CAPILLARY: Glucose-Capillary: 129 mg/dL — ABNORMAL HIGH (ref 65–99)

## 2016-01-24 MED ORDER — HALOPERIDOL 1 MG PO TABS: 0.5000 mg | ORAL_TABLET | ORAL | Status: DC | PRN

## 2016-01-24 MED ORDER — SODIUM CHLORIDE 0.9% FLUSH
3.0000 mL | Freq: Two times a day (BID) | INTRAVENOUS | Status: DC
  Administered 2016-01-24 – 2016-01-25 (×2): 3 mL via INTRAVENOUS

## 2016-01-24 MED ORDER — HALOPERIDOL LACTATE 2 MG/ML PO CONC
0.5000 mg | ORAL | Status: DC | PRN
  Filled 2016-01-24: qty 0.3

## 2016-01-24 MED ORDER — HALOPERIDOL LACTATE 5 MG/ML IJ SOLN: 0.5000 mg | INTRAMUSCULAR | Status: DC | PRN

## 2016-01-24 MED ORDER — GLYCOPYRROLATE 0.2 MG/ML IJ SOLN: 0.2000 mg | INTRAMUSCULAR | Status: DC | PRN

## 2016-01-24 MED ORDER — CEFTRIAXONE SODIUM-DEXTROSE 2-2.22 GM-% IV SOLR
2.0000 g | INTRAVENOUS | Status: DC
  Filled 2016-01-24: qty 50

## 2016-01-24 MED ORDER — BIOTENE DRY MOUTH MT LIQD: 15.0000 mL | OROMUCOSAL | Status: DC | PRN

## 2016-01-24 MED ORDER — ONDANSETRON HCL 4 MG/2ML IJ SOLN: 4.0000 mg | Freq: Four times a day (QID) | INTRAMUSCULAR | Status: DC | PRN

## 2016-01-24 MED ORDER — MEROPENEM-SODIUM CHLORIDE 500 MG/50ML IV SOLR
500.0000 mg | Freq: Two times a day (BID) | INTRAVENOUS | Status: DC
  Filled 2016-01-24: qty 50

## 2016-01-24 MED ORDER — DEXTROSE 5 % IV SOLN
2.0000 g | INTRAVENOUS | Status: DC
  Filled 2016-01-24: qty 2

## 2016-01-24 MED ORDER — SODIUM CHLORIDE 0.9% FLUSH: 3.0000 mL | INTRAVENOUS | Status: DC | PRN

## 2016-01-24 MED ORDER — MORPHINE SULFATE (PF) 4 MG/ML IV SOLN
0.5000 mg | INTRAVENOUS | Status: DC | PRN
  Administered 2016-01-24 (×3): 0.52 mg via INTRAVENOUS
  Administered 2016-01-25: 14:00:00 2 mg via INTRAVENOUS
  Administered 2016-01-25: 0.52 mg via INTRAVENOUS
  Administered 2016-01-25: 1 mg via INTRAVENOUS
  Filled 2016-01-24 (×6): qty 1

## 2016-01-24 MED ORDER — POLYVINYL ALCOHOL 1.4 % OP SOLN
1.0000 [drp] | Freq: Four times a day (QID) | OPHTHALMIC | Status: DC | PRN
  Filled 2016-01-24: qty 15

## 2016-01-24 MED ORDER — ONDANSETRON 4 MG PO TBDP
4.0000 mg | ORAL_TABLET | Freq: Four times a day (QID) | ORAL | Status: DC | PRN
  Filled 2016-01-24: qty 1

## 2016-01-24 MED ORDER — SODIUM CHLORIDE 0.9 % IV SOLN: 250.0000 mL | INTRAVENOUS | Status: DC | PRN

## 2016-01-24 MED ORDER — GLYCOPYRROLATE 1 MG PO TABS: 1.0000 mg | ORAL_TABLET | ORAL | Status: DC | PRN

## 2016-01-24 NOTE — Progress Notes (Signed)
Nutrition Brief Note  Chart reviewed. Pt now transitioning to comfort care.  No further nutrition interventions warranted at this time.  Please re-consult as needed.    Kevonna Nolte MS, RD, LDN [phone] Pager  [phone] Weekend/On-Call Pager   

## 2016-01-24 NOTE — Progress Notes (Addendum)
Daily Progress Note   Patient Name: Tracy Jackson       Date: 01/24/2016 DOB: 23-Oct-1929  Age: 83 y.o. MRN#: [id number] Attending Physician: Henreitta Leber, MD Primary Care Physician: Kirk Ruths., MD Admit Date: 01/21/2016  Reason for Consultation/Follow-up: Establishing goals of care  Subjective: Patient to weak to really speak or answer questions.  Per grand dtr she has been asking [facility] to take her.  Met dtr and grand dtr at bedside.  They are happy the bipap mask is off.  They do not want to escalate care.  They asked about hospice house.  We discussed morphine prn.  The dtr asked for a smaller dose (0.5 mg).  She wants her mother comfortable, but does not want to rush her passing.   Assessment: 83 yo very frail female with bacteremia, UTI and pneumonia.  Family requesting supportive care only.   Patient Profile/HPI:  83 y.o. female  with past medical history of COPD, anxiety, and osteoporosis who was admitted on 01/21/2016 with acute hypoxic respiratory failure and altered mental status.  Initial work up revealed that Mrs. Mcfadyen was septic likely due to pneumonia.  Blood cultures has since revealed gram negative coccobacilli in 1 of 2 cultures and her urine culture is positive for e-coli.  Since admission she has remained lethargic and on Bipap in the ICU.    Length of Stay: 3  Current Medications: Scheduled Meds:  . azithromycin  500 mg Intravenous Q24H  . carvedilol  3.125 mg Oral BID WC  . chlorhexidine  15 mL Mouth Rinse BID  . feeding supplement (ENSURE ENLIVE)  237 mL Oral BID BM  . heparin  5,000 Units Subcutaneous Q8H  . latanoprost  1 drop Both Eyes QHS  . mouth rinse  15 mL Mouth Rinse q12n4p  . meropenem  500 mg Intravenous Q12H  . mirtazapine  15 mg Oral  QHS  . sertraline  25 mg Oral Daily  . sodium chloride flush  3 mL Intravenous Q12H    Continuous Infusions: . dextrose 5 % and 0.45% NaCl 50 mL/hr at 01/24/16 0800    PRN Meds: acetaminophen **OR** acetaminophen, Influenza vac split quadrivalent PF, levalbuterol, LORazepam, morphine injection, ondansetron **OR** ondansetron (ZOFRAN) IV  Physical Exam  Constitutional:  Very frail female.  Eyes closed.  Very weak voice.  HENT:  Head: Normocephalic and atraumatic.  Eyes:  closed  Neck: Neck supple. No thyromegaly present.  Cardiovascular: Normal rate and regular rhythm.   Pulmonary/Chest:  Mild to moderately increased work of breathing on 4L n/c  Abdominal:  Thin.  NT ND  Musculoskeletal: She exhibits no edema or deformity.  Skin: Skin is warm and dry.           Vital Signs: BP 127/67 (BP Location: Left Arm)   Pulse 95   Temp 98.3 F (36.8 C) (Axillary)   Resp (!) 26   Ht 5' 4"  (1.626 m)   Wt 34 kg (74 lb 15.3 oz)   SpO2 100%   BMI 12.87 kg/m  SpO2: SpO2: 100 % O2 Device: O2 Device: Nasal Cannula O2 Flow Rate: O2 Flow Rate (L/min): 4 L/min  Intake/output summary:  Intake/Output Summary (Last 24 hours) at 01/24/16 0920 Last data filed at 01/24/16 0800  Gross per 24 hour  Intake              975 ml  Output                0 ml  Net              975 ml   LBM: Last BM Date: 01/21/16 Baseline Weight: Weight: 47.7 kg (105 lb 2.6 oz) Most recent weight: Weight: 34 kg (74 lb 15.3 oz)       Palliative Assessment/Data:    Flowsheet Rows   Flowsheet Row Most Recent Value  Intake Tab  Referral Department  Critical care  Unit at Time of Referral  ICU  Palliative Care Primary Diagnosis  Sepsis/Infectious Disease  Date Notified  01/23/16  Palliative Care Type  New Palliative care  Reason for referral  Clarify Goals of Care  Date of Admission  01/21/16  Date first seen by Palliative Care  01/23/16  # of days Palliative referral response time  0 Day(s)  # of days  IP prior to Palliative referral  2  Clinical Assessment  Palliative Performance Scale Score  10%  Psychosocial & Spiritual Assessment  Palliative Care Outcomes  Patient/Family meeting held?  Yes  Who was at the meeting?  patient, daughter, grand dtr  Palliative Care Outcomes  Clarified goals of care, Improved non-pain symptom therapy, Counseled regarding hospice      Patient Active Problem List   Diagnosis Date Noted  . Protein-calorie malnutrition, severe 01/23/2016  . Goals of care, counseling/discussion   . Palliative care encounter   . Gram-negative pneumonia ([place])   . Sepsis ([place]) 01/21/2016  . CAP (community acquired pneumonia) 01/21/2016  . HTN (hypertension) 01/21/2016  . COPD (chronic obstructive pulmonary disease) ([place]) 01/21/2016  . Anxiety 01/21/2016  . Elevated troponin 01/21/2016    Palliative Care Plan    Recommendations/Plan:  No escalation of care.  No Bipap.  Will begin to pull back medications not related to comfort:  Aspirin, steroids, insulin  PRN comfort meds - morphine, ativan.  Patient is very medication sensitive  Will place social work consult to engage hospice.  If patient is stable she can probably transfer tomorrow pending bed availability.  Consider transfer out of ICU  The family has asked me to check back later today to determine if they want to continue IVF and antibiotics.  Goals of Care and Additional Recommendations:  Limitations on Scope of Treatment: Full Comfort Care  Code Status:  DNR  Prognosis:   Hours - Days given weakness, deconditioning, gram  neg bacteremia, pneumonia, patient is willing herself to be taken by [facility].   Discharge Planning:  Hospice facility  Care plan was discussed with family and bedside RN  Thank you for allowing the Palliative Medicine Team to assist in the care of this patient.  Total time spent:  32 min.     Greater than 50%  of this time was spent counseling and coordinating care related  to the above assessment and plan.  Imogene Burn, PA-C Palliative Medicine  Please contact Palliative MedicineTeam phone at [phone] for questions and concerns between 7 am - 7 pm.   Please see AMION for individual provider pager numbers.     '

## 2016-01-24 NOTE — Progress Notes (Signed)
Late entry.  Pt has been lethargic all shift. Has not been able to take po medications, unable to swab mouth, since pt needed bipap and desats when mask is removed. Was on 40% then changed to 35% by RT. Family has been at bedside. BP has been stable and heart rate SR. No anxiety noted, and no evidence of pain or discomfort. Palliative Care visited with patient and family will talk about withdrawal of care tomorrow.

## 2016-01-24 NOTE — Progress Notes (Signed)
Report called to Robin on 1C.  Pt will tranfer in 1C bed.  No O2 required.

## 2016-01-24 NOTE — Consult Note (Signed)
[facility] Consultation     ASSESSMENT/PLAN    PULMONARY A: Acute on chronic respiratory acidosis, ABG 7.17/99/82/28.8. This is improved from previous, but still life-threatening, and uncompensated. View chest x-ray images: Emphysematous changes with left-sided pneumonia/atelectasis. P:   --Continued severe respiratory failure, despite BiPAP support, and appropriate antibiotic treatment of her pneumonia. -Continue current antibiotics, BiPAP as tolerated. -Given lack of progress, advanced age, comorbidities, poor chance for meaningful recovery. We should consider transition to comfort measures if the patient remained BiPAP dependent.  CARDIOVASCULAR A: Echo 01/22/16: EF 65% P:  --  RENAL A:  Acute kidney injury on chronic disease. P:   --  GASTROINTESTINAL A:  Advance diet when tolerated. P:   --  HEMATOLOGIC A:  -- P:  --  INFECTIOUS A:  Pneumonia with sepsis, suspected due to Haemophilus influenza.Marland Kitchen P:    Micro/culture results:  BCx2 1/1: . Gram-negative coccobacilli UC one/one: Negative to date Sputum--- MRSA PCR is, 1/2:negative.   Antibiotics: Azithromycin 1/2>> Ceftriaxone 1/2>>  ENDOCRINE A:  Monitor blood sugars P:   --  NEUROLOGIC A: Acute metabolic encephalopathy secondary to sepsis. P:     MAJOR EVENTS/TEST RESULTS:   Best Practices  DVT Prophylaxis: Heparin subcutaneous GI Prophylaxis: --   ---------------------------------------  ---------------------------------------   Name: Tracy Jackson MRN: [id number] DOB: 15-Feb-1929    ADMISSION DATE:  01/21/2016 CONSULTATION DATE:  01/23/15  REFERRING MD :  Dr. Ether Griffins  CHIEF COMPLAINT:  Dyspnea.    HISTORY OF PRESENT ILLNESS:   The patient is an 83 year old female, currently she is on BiPAP and lethargic, therefore, all history is obtained from the chart and from staff. patient is normally high functioning at baseline, making her own meals and living  alone. However, over this past week she has been laying in bed much more, and family went to see her after they felt like she was not doing well for a day or so and they found her in bed in her own stool unable to get up. Here in the hospital she was found to have pneumonia and met sepsis criteria. Hospitalists were called for admission. Since her admission to the intensive care unit, she is been very lethargic, and minimally responsive. She is required BiPAP around the clock, her initial arterial blood gas showed severe respiratory acidosis, which has improved mildly with use of BiPAP. Due to her small size of the face. We have been having difficulty obtaining a proper seal with BiPAP with frequent leaks.  PAST MEDICAL HISTORY :  Past Medical History:  Diagnosis Date  . Anxiety   . COPD (chronic obstructive pulmonary disease) ([place])   . Hypertension   . Osteoarthritis   . Osteoporosis    Past Surgical History:  Procedure Laterality Date  . REFRACTIVE SURGERY     Prior to Admission medications   Medication Sig Start Date End Date Taking? Authorizing Provider  aspirin EC 81 MG tablet Take 81 mg by mouth daily.   Yes Historical Provider, MD  carvedilol (COREG) 3.125 MG tablet Take 3.125 mg by mouth 2 (two) times daily with a meal.   Yes Historical Provider, MD  latanoprost (XALATAN) 0.005 % ophthalmic solution Place 1 drop into both eyes at bedtime.   Yes Historical Provider, MD  mirtazapine (REMERON) 15 MG tablet Take 15 mg by mouth at bedtime.   Yes Historical Provider, MD  sertraline (ZOLOFT) 25 MG tablet Take 25 mg by mouth daily.   Yes Historical Provider, MD  No Known Allergies  FAMILY HISTORY:  No family history on file. SOCIAL HISTORY:  reports that she has quit smoking. She has never used smokeless tobacco. She reports that she does not drink alcohol or use drugs.  REVIEW OF SYSTEMS:   Constitutional: Feels well. Cardiovascular: No chest pain.  Pulmonary: Denies dyspnea.     The remainder of systems were reviewed and were found to be negative other than what is documented in the HPI.    VITAL SIGNS: Temp:  [97.8 F (36.6 C)-98.8 F (37.1 C)] 98.3 F (36.8 C) (01/04 0800) Pulse Rate:  [71-105] 105 (01/04 1000) Resp:  [14-27] 23 (01/04 1000) BP: (86-132)/(51-73) 132/63 (01/04 1000) SpO2:  [88 %-100 %] 96 % (01/04 1000) FiO2 (%):  [35 %] 35 % (01/03 2000) HEMODYNAMICS:   VENTILATOR SETTINGS: FiO2 (%):  [35 %] 35 % INTAKE / OUTPUT:  Intake/Output Summary (Last 24 hours) at 01/24/16 1100 Last data filed at 01/24/16 1000  Gross per 24 hour  Intake              925 ml  Output                0 ml  Net              925 ml    Physical Examination:   VS: BP 132/63 (BP Location: Left Arm)   Pulse (!) 105   Temp 98.3 F (36.8 C) (Axillary)   Resp (!) 23   Ht 5' 4"  (1.626 m)   Wt 74 lb 15.3 oz (34 kg)   SpO2 96%   BMI 12.87 kg/m   General Appearance: No distress  Neuro:without focal findings, mental status, speech , Reduced HEENT: PERRLA, EOM intact, no ptosis, no other lesions noticed;  Pulmonary: Decreased air entry bilaterally CardiovascularNormal S1,S2.  No m/r/g.    Abdomen: Benign, Soft, non-tender, No masses, hepatosplenomegaly, No lymphadenopathy Renal:  No costovertebral tenderness  GU:  Not performed at this time. Endoc: No evident thyromegaly, no signs of acromegaly. Skin:   warm, no rashes, no ecchymosis  Extremities: normal, no cyanosis, clubbing, no edema, warm with normal capillary refill.    LABS: Reviewed   LABORATORY PANEL:   CBC  Recent Labs Lab 01/24/16 0343  WBC 17.9*  HGB 10.2*  HCT 31.3*  PLT 116*    Chemistries   Recent Labs Lab 01/21/16 1910  01/24/16 0343  NA 150*  < > 147*  K 3.5  < > 3.8  CL 111  < > 116*  CO2 30  < > 26  GLUCOSE 158*  < > 160*  BUN 74*  < > 74*  CREATININE 1.80*  < > 1.89*  CALCIUM 9.0  < > 8.4*  AST 31  --   --   ALT 18  --   --   ALKPHOS 72  --   --   BILITOT 0.5  --    --   < > = values in this interval not displayed.   Recent Labs Lab 01/21/16 1910 01/22/16 0212 01/23/16 1201 01/23/16 1648 01/23/16 2106 01/24/16 0726  GLUCAP 140* 160* 144* 163* 161* 129*    Recent Labs Lab 01/22/16 0501 01/23/16 0415 01/24/16 0818  PHART 7.24* 7.17* 7.27*  PCO2ART 58* 79* 65*  PO2ART 83 82* 82*    Recent Labs Lab 01/21/16 1910  AST 31  ALT 18  ALKPHOS 72  BILITOT 0.5  ALBUMIN 3.2*    Cardiac Enzymes  Recent Labs  Lab 01/22/16 1107  TROPONINI 0.05*    RADIOLOGY:  Dg Chest 1 View  Result Date: 01/24/2016 CLINICAL DATA:  Shortness of breath. EXAM: CHEST 1 VIEW COMPARISON:  01/21/2016.  06/12/2009. FINDINGS: Mediastinum and hilar structures normal. Heart size normal. Interim near complete clearing of left upper lobe infiltrate. Mild left base infiltrate cannot be excluded. Small left pleural effusion . right costophrenic angle not imaged. Stable biapical pleural thickening consistent with scarring. IMPRESSION: 1.  Interim near complete clearing of left upper lobe infiltrate. 2. Mild left lower lobe infiltrate and small left pleural effusion. 3. Stable biapical pleuroparenchymal thickening consistent with scarring . Electronically Signed   By: Marcello Moores  Register   On: 01/24/2016 06:42       --Marda Stalker, MD.  Board Certified in Internal Medicine, Pulmonary Medicine, [place], and Sleep Medicine.  ICU Pager [phone] [facility] Office Number: [phone]  Patricia Pesa, M.D.  Vilinda Boehringer, M.D.  Merton Border, M.D   01/24/2016, 11:00 AM   [place].  I have personally obtained a history, examined the patient, evaluated laboratory and imaging results, formulated the assessment and plan and placed orders. The Patient requires high complexity decision making for assessment and support, frequent evaluation and titration of therapies, application of advanced monitoring technologies  and extensive interpretation of multiple databases. The patient has critical illness that could lead imminently to failure of 1 or more organ systems and requires the highest level of physician preparedness to intervene.  Critical Care Time devoted to patient care services described in this note is 45 minutes and is exclusive of time spent in procedures.

## 2016-01-24 NOTE — Progress Notes (Signed)
No charge note  I spoke with extended family at bedside.  Patient is now full comfort measures only.  No further antibiotic, fluids, oxygen.  Hospice House if she is stable enough tomorrow.  Algis DownsMarianne York, [place] JerseyPA-C Palliative Medicine Pager: [phone]

## 2016-01-24 NOTE — Progress Notes (Signed)
[facility] hosp[facility] home referral [facility]. Monica made aware there are currently no beds available for transfer. Patient is currently on comfort care awaiting transfer out of the ICU. Writer spoke with patient's daughter Rubye OaksSheryl Hall to make an introduction and give a brief overview of hospice home services. Sheryl is aware there are currently no beds available at the hospice home. Writer to follow up in the morning with family and hospital care team. Thank you. Dayna BarkerKaren Robertson RN, BN, [facility]  [place], hospital Liaison [phone] c

## 2016-01-24 NOTE — Progress Notes (Signed)
Family caring for patient in room awaiting transfer to Med Surg.  Continuing fluids and ABX for now but family states they will probably transition to full comfort care tomorrow.  Family utilizing oral swabs in room PRN; pt reports no pain.  Repositioning patient as family requests, patient sleeping between care.

## 2016-01-24 NOTE — Progress Notes (Signed)
[facility] [place] [facility]   PATIENT NAME: Tracy Jackson    MR#:  [id number]  DATE OF BIRTH:  [birth year]  SUBJECTIVE:   Pt. Here  due to acute respiratory failure with hypoxia and hypercapnia due to Pneumonia. Seen by Palliative Care and pt. Is now COMFORT CARE ONLY.  Family at bedside this a.m.   REVIEW OF SYSTEMS:    Review of Systems  Unable to perform ROS: Mental acuity    Nutrition: Pleasure feeds Tolerating Diet: As tolerated  DRUG ALLERGIES:  No Known Allergies  VITALS:  Blood pressure 127/71, pulse (!) 103, temperature 98.6 F (37 C), temperature source Axillary, resp. rate (!) 31, height 5\' 4"  (1.626 m), weight 34 kg (74 lb 15.3 oz), SpO2 92 %.  PHYSICAL EXAMINATION:   Physical Exam  GENERAL:  83 y.o.-year-old cachectic patient lying in the bed in no acute distress.  EYES: Pupils equal, round, reactive to light and accommodation. No scleral icterus. Extraocular muscles intact.  HEENT: Head atraumatic, normocephalic. Oropharynx and nasopharynx clear.  NECK:  Supple, no jugular venous distention. No thyroid enlargement, no tenderness.  LUNGS: Normal breath sounds bilaterally, no wheezing, rales, rhonchi. No use of accessory muscles of respiration.  CARDIOVASCULAR: S1, S2 normal. No murmurs, rubs, or gallops.  ABDOMEN: Soft, nontender, nondistended. Bowel sounds present. No organomegaly or mass.  EXTREMITIES: No cyanosis, clubbing or edema b/l.    NEUROLOGIC: Cranial nerves II through XII are intact. No focal Motor or sensory deficits b/l.  Globally weak.  PSYCHIATRIC: The patient is alert and oriented x 1.  SKIN: No obvious rash, lesion, or ulcer.    LABORATORY PANEL:   CBC  Recent Labs Lab 01/24/16 0343  WBC 17.9*  HGB 10.2*  HCT 31.3*  PLT 116*   ------------------------------------------------------------------------------------------------------------------  Chemistries   Recent Labs Lab 01/21/16 1910  01/24/16 0343   NA 150*  < > 147*  K 3.5  < > 3.8  CL 111  < > 116*  CO2 30  < > 26  GLUCOSE 158*  < > 160*  BUN 74*  < > 74*  CREATININE 1.80*  < > 1.89*  CALCIUM 9.0  < > 8.4*  AST 31  --   --   ALT 18  --   --   ALKPHOS 72  --   --   BILITOT 0.5  --   --   < > = values in this interval not displayed. ------------------------------------------------------------------------------------------------------------------  Cardiac Enzymes  Recent Labs Lab 01/22/16 1107  TROPONINI 0.05*   ------------------------------------------------------------------------------------------------------------------  RADIOLOGY:  Dg Chest 1 View  Result Date: 01/24/2016 CLINICAL DATA:  Shortness of breath. EXAM: CHEST 1 VIEW COMPARISON:  01/21/2016.  06/12/2009. FINDINGS: Mediastinum and hilar structures normal. Heart size normal. Interim near complete clearing of left upper lobe infiltrate. Mild left base infiltrate cannot be excluded. Small left pleural effusion . right costophrenic angle not imaged. Stable biapical pleural thickening consistent with scarring. IMPRESSION: 1.  Interim near complete clearing of left upper lobe infiltrate. 2. Mild left lower lobe infiltrate and small left pleural effusion. 3. Stable biapical pleuroparenchymal thickening consistent with scarring . Electronically Signed   By: Maisie Fus  Register   On: 01/24/2016 06:42     ASSESSMENT AND PLAN:   83 year old female with past medical history of osteoarthritis, hypertension, COPD, anxiety to the hospital due to altered mental status and weakness and noted to be in acute respiratory failure with hypoxia and hypercapnia secondary to pneumonia.  1. Sepsis  secondary to pneumonia 2. pneumonia secondary to Haemophilus. 3. Acute Resp. Failure w/ Hypoxia, Hypercapnia due to #2.  4. Hypernatremia 5. Adult Failure to Thrive 6. UTI 7. Severe Protein-Calorie Malnutrition.   - pt. Was being aggressively treated with broad-spectrum IV antibiotics,  BiPAP support, IV fluids. Her clinical status was not improving. A palliative care consult was obtained to discuss goals of care. After palliative care meeting with the family the family does not want to pursue further aggressive care and therefore patient is now comfort care only. -Continue morphine, Ativan, glycopyrrolate.  Transfer to hospice home when bed available.  All the records are reviewed and case discussed with Care Management/Social Worker. Management plans discussed with the patient, family and they are in agreement.  CODE STATUS: DNR  TOTAL TIME TAKING CARE OF THIS PATIENT: 25 minutes.   POSSIBLE D/C 1-2 DAYS to hospice home if bed available.    Houston SirenSAINANI,Leonid Manus J M.D on 01/24/2016 at 5:20 PM  Between 7am to 6pm - Pager - [phone]  After 6pm go to [email or web address] - Social research officer, governmentpassword EPAS [facility]  [facility] [place] [facility]  Office  [phone]  CC: Primary care physician; Lauro RegulusANDERSON,MARSHALL W., MD

## 2016-01-24 NOTE — Clinical Social Work Note (Signed)
CSW has received consult for hospice home. Palliative Care has documented in consult that patient is not stable enough for transfer today but possibly would be tomorrow. Clydie BraunKaren with Hospice notified and given referral. Clydie BraunKaren states that there is a waiting list currently at hospice home. [place] SpanielMonica Prisilla Kocsis MSW,LCSW [phone]

## 2016-01-24 NOTE — Progress Notes (Signed)
Pharmacy Antibiotic Note  Tracy Jackson is a 83 y.o. female with a h/o COPD admitted on 01/21/2016 with  pneumonia.  Pharmacy has been consulted for ceftriaxone dosing. Patient expanded to meropenem on 1/3 for possible ESBL infection, however Ecoli is pan sensitive.   Plan: Will initiate ceftriaxone 2g IV every 24 hours to cover Ecoli in urine and H influenzae bacteremia.    Height: 5\' 4"  (162.6 cm) Weight: 74 lb 15.3 oz (34 kg) IBW/kg (Calculated) : 54.7  Temp (24hrs), Avg:98.4 F (36.9 C), Min:97.8 F (36.6 C), Max:98.8 F (37.1 C)   Recent Labs Lab 01/21/16 1910 01/21/16 2229 01/22/16 0531 01/22/16 1107 01/23/16 0318 01/24/16 0343  WBC 14.4*  --  12.2*  --   --  17.9*  CREATININE 1.80*  --  1.93* 2.14* 2.05* 1.89*  LATICACIDVEN 2.8* 1.1  --   --   --   --     Estimated Creatinine Clearance: 11.9 mL/min (by C-G formula based on SCr of 1.89 mg/dL (H)).    No Known Allergies  Antimicrobials this admission: Azithromycin 1/1 >> 1/4 Ceftriaxone 1/1 >> 1/4, 1/4 >> Meropenem 1/3 >> 1/4  Dose adjustments this admission: N/A   Microbiology results: 1/1 BCx: H influenzae 1/2 1/1 UCx: E coli- Pan Sensitive 1/2 MRSA PCR: negative  Pharmacy will continue to monitor and adjust per consult.    Oliver Neuwirth L 01/24/2016 12:13 PM

## 2016-01-25 LAB — BLOOD GAS, ARTERIAL
FIO2: 1
PATIENT TEMPERATURE: 37
PH ART: 7.02 — AB (ref 7.350–7.450)
pCO2 arterial: 120 mmHg (ref 32.0–48.0)
pO2, Arterial: 264 mmHg — ABNORMAL HIGH (ref 83.0–108.0)

## 2016-01-25 MED ORDER — MORPHINE SULFATE (PF) 4 MG/ML IV SOLN: 0.5000 mg | INTRAVENOUS | 0 refills | Status: AC | PRN

## 2016-01-25 MED ORDER — GLYCOPYRROLATE 1 MG PO TABS: 1.0000 mg | ORAL_TABLET | ORAL | Status: AC | PRN

## 2016-01-25 MED ORDER — LORAZEPAM 2 MG/ML IJ SOLN: 0.2500 mg | INTRAMUSCULAR | 0 refills | Status: AC | PRN

## 2016-01-25 NOTE — Clinical Social Work Note (Signed)
[facility] [place]  Patient and family agreeable to plans will transport via ems [facility]osp[facility] RN will call report.  Windell MouldingEric Belva Koziel, MSW, Theresia MajorsLCSWA [phone]

## 2016-01-25 NOTE — Care Management (Signed)
Comfort care measures continue. Shallow respirations. Family at the bedside.  Gwenette GreetBrenda S Mechele Kittleson RN MSN CCM Care Management

## 2016-01-25 NOTE — Progress Notes (Signed)
[facility] with [facility], [facility] at [phone], to notify of non-emergent EMS transport.  Auth notification reference given as [id number].   Service date range good for 90 days starting from 01/25/16.   Geographical gap exception requested to determine if services can be considered at an in-network level.

## 2016-01-25 NOTE — Progress Notes (Signed)
Follow up visit made to new hospice home referral. Writer spoke again with patient's daughter's Tora DuckSheryl and Okey RegalCarol. Advised there was not a bed available yet. Patient seen lying in bed, supine, respirations even, unlabored. Patient had just received IV morphine for pain Writer provided mouth care, patient did respond by closing her mouth.Family open to Clinical research associatewriter initiating education regarding hospice services, philosophy and team approach to care with good understanding voiced. Questions answered, consents signed. Plan is to dischared to the hospice home when bed becomes available. Writer provided end of life education and provided the Gone From My sight Book". Family appreciative. Hospital care team all aware. Dayna BarkerKaren Robertson RN, BSN, [facility] [place], hospital Liaison [phone] c

## 2016-01-25 NOTE — Care Management Important Message (Signed)
Important Message  Patient Details  Name: Tracy AmasLillian P Rada MRN: [id number] Date of Birth: 03/21/1929   Medicare Important Message Given:  Yes    Gwenette GreetBrenda S Sharonda Llamas, RN 01/25/2016, 9:17 AM

## 2016-01-25 NOTE — Discharge Summary (Signed)
[facility] [place] [facility]   PATIENT NAME: Tracy Jackson    MR#:  [id number]  DATE OF BIRTH:  November 06, 1929  DATE OF ADMISSION:  01/21/2016 ADMITTING PHYSICIAN: Oralia Manis, MD  DATE OF DISCHARGE: 01/25/2016  PRIMARY CARE PHYSICIAN: Lauro Regulus., MD    ADMISSION DIAGNOSIS:  Sepsis, due to unspecified organism (HCC) [A41.9] Community acquired pneumonia, unspecified laterality [J18.9]  DISCHARGE DIAGNOSIS:  Principal Problem:   Sepsis (HCC) Active Problems:   CAP (community acquired pneumonia)   HTN (hypertension)   Anxiety   Elevated troponin   Protein-calorie malnutrition, severe   Goals of care, counseling/discussion   Palliative care encounter   Gram-negative pneumonia (HCC)   Encounter for hospice care discussion   Terminal care   SECONDARY DIAGNOSIS:   Past Medical History:  Diagnosis Date  . Anxiety   . COPD (chronic obstructive pulmonary disease) (HCC)   . Hypertension   . Osteoarthritis   . Osteoporosis     HOSPITAL COURSE:   83 year old female with past medical history of osteoarthritis, hypertension, COPD, anxiety to the hospital due to altered mental status and weakness and noted to be in acute respiratory failure with hypoxia and hypercapnia secondary to pneumonia.  1. Sepsis secondary to pneumonia 2. pneumonia secondary to Haemophilus. 3. Urinary Tract infection.  4. Acute Resp. Failure w/ Hypoxia, Hypercapnia due to #2.  5. Hypernatremia 6. Adult Failure to Thrive 7. UTI 8. Severe Protein-Calorie Malnutrition.   - pt. Was being aggressively treated with broad-spectrum IV antibiotics, BiPAP support, IV fluids. Her clinical status was not improving. A palliative care consult was obtained to discuss goals of care. After palliative care meeting with the family the family does not want to pursue further aggressive care and therefore patient is now comfort care only. -Continue morphine, Ativan, glycopyrrolate. - pt. Now  being discharged to [facility].   DISCHARGE CONDITIONS:   Guarded  CONSULTS OBTAINED:    DRUG ALLERGIES:  No Known Allergies  DISCHARGE MEDICATIONS:   Allergies as of 01/25/2016   No Known Allergies     Medication List    STOP taking these medications   aspirin EC 81 MG tablet   carvedilol 3.125 MG tablet Commonly known as:  COREG   latanoprost 0.005 % ophthalmic solution Commonly known as:  XALATAN   mirtazapine 15 MG tablet Commonly known as:  REMERON   sertraline 25 MG tablet Commonly known as:  ZOLOFT     TAKE these medications   glycopyrrolate 1 MG tablet Commonly known as:  ROBINUL Take 1 tablet (1 mg total) by mouth every 4 (four) hours as needed (excessive secretions).   LORazepam 2 MG/ML injection Commonly known as:  ATIVAN Inject 0.13 mLs (0.26 mg total) into the vein every 3 (three) hours as needed for anxiety.   morphine 4 MG/ML injection Inject 0.125-0.5 mLs (0.5-2 mg total) into the vein every 15 (fifteen) minutes as needed (dyspnea).         DISCHARGE INSTRUCTIONS:   DIET:  Npo except food for comfort  DISCHARGE CONDITION:  Serious  ACTIVITY:  Activity as tolerated  OXYGEN:  Home Oxygen: No.   Oxygen Delivery: room air  DISCHARGE LOCATION:  Hospice Home   If you experience worsening of your admission symptoms, develop shortness of breath, life threatening emergency, suicidal or homicidal thoughts you must seek medical attention immediately by calling 911 or calling your MD immediately  if symptoms less severe.  You Must read complete instructions/literature along with all the  possible adverse reactions/side effects for all the Medicines you take and that have been prescribed to you. Take any new Medicines after you have completely understood and accpet all the possible adverse reactions/side effects.   Please note  You were cared for by a hospitalist during your hospital stay. If you have any questions about your discharge  medications or the care you received while you were in the hospital after you are discharged, you can call the unit and asked to speak with the hospitalist on call if the hospitalist that took care of you is not available. Once you are discharged, your primary care physician will handle any further medical issues. Please note that NO REFILLS for any discharge medications will be authorized once you are discharged, as it is imperative that you return to your primary care physician (or establish a relationship with a primary care physician if you do not have one) for your aftercare needs so that they can reassess your need for medications and monitor your lab values.     Today   Pt. Lying in bed non-verbal but in NAD.    VITAL SIGNS:  Blood pressure 118/64, pulse 91, temperature 97.6 F (36.4 C), temperature source Oral, resp. rate (!) 23, height 5\' 4"  (1.626 m), weight 34 kg (74 lb 15.3 oz), SpO2 (!) 89 %.  I/O:  No intake or output data in the 24 hours ending 01/25/16 1409  PHYSICAL EXAMINATION:  GENERAL:  83 y.o.-year-old patient lying in the bed comfortable and in NAD.   EYES: No scleral icterus.  HEENT: Head atraumatic, normocephalic. Oropharynx and nasopharynx clear.  NECK:  Supple, no jugular venous distention. No thyroid enlargement, no tenderness.  LUNGS: shallow respirations.  NO rales, rhonchi, wheezes  CARDIOVASCULAR: S1, S2 normal. No murmurs, rubs, or gallops.  ABDOMEN: Soft, non-tender, non-distended. Bowel sounds present. No organomegaly or mass.  EXTREMITIES: No pedal edema, cyanosis, or clubbing.  NEUROLOGIC: Encephalopathic, Lethargic.  PSYCHIATRIC: Encephalopathic, Lethargic.  SKIN: No obvious rash, lesion, or ulcer.   DATA REVIEW:   CBC  Recent Labs Lab 01/24/16 0343  WBC 17.9*  HGB 10.2*  HCT 31.3*  PLT 116*    Chemistries   Recent Labs Lab 01/21/16 1910  01/24/16 0343  NA 150*  < > 147*  K 3.5  < > 3.8  CL 111  < > 116*  CO2 30  < > 26  GLUCOSE  158*  < > 160*  BUN 74*  < > 74*  CREATININE 1.80*  < > 1.89*  CALCIUM 9.0  < > 8.4*  AST 31  --   --   ALT 18  --   --   ALKPHOS 72  --   --   BILITOT 0.5  --   --   < > = values in this interval not displayed.  Cardiac Enzymes  Recent Labs Lab 01/22/16 1107  TROPONINI 0.05*    Microbiology Results  Results for orders placed or performed during the hospital encounter of 01/21/16  Blood culture (routine x 2)     Status: None (Preliminary result)   Collection Time: 01/21/16  7:10 PM  Result Value Ref Range Status   Specimen Description BLOOD LEFT AC  Final   Special Requests   Final    BOTTLES DRAWN AEROBIC AND ANAEROBIC AER ANA   Culture NO GROWTH 4 DAYS  Final   Report Status PENDING  Incomplete  Blood culture (routine x 2)     Status: Abnormal (  Preliminary result)   Collection Time: 01/21/16  7:10 PM  Result Value Ref Range Status   Specimen Description BLOOD RIGHT AC  Final   Special Requests   Final    BOTTLES DRAWN AEROBIC AND ANAEROBIC AER ANA   Culture  Setup Time   Final    GRAM NEGATIVE COCCOBACILLI AEROBIC BOTTLE ONLY CRITICAL RESULT CALLED TO, READ BACK BY AND VERIFIED WITH: NATE COOKSON ON 01/23/16 AT 0100 BY TLB    Culture (A)  Final    HAEMOPHILUS INFLUENZAE BETA LACTAMASE NEGATIVE Referred to [facility] in [place], [place] for Serotyping. HEALTH DEPARTMENT NOTIFIED Performed at [facility]    Report Status PENDING  Incomplete  Blood Culture ID Panel (Reflexed)     Status: Abnormal   Collection Time: 01/21/16  7:10 PM  Result Value Ref Range Status   Enterococcus species NOT DETECTED NOT DETECTED Final   Listeria monocytogenes NOT DETECTED NOT DETECTED Final   Staphylococcus species NOT DETECTED NOT DETECTED Final   Staphylococcus aureus NOT DETECTED NOT DETECTED Final   Streptococcus species NOT DETECTED NOT DETECTED Final   Streptococcus agalactiae NOT DETECTED NOT DETECTED Final    Streptococcus pneumoniae NOT DETECTED NOT DETECTED Final   Streptococcus pyogenes NOT DETECTED NOT DETECTED Final   Acinetobacter baumannii NOT DETECTED NOT DETECTED Final   Enterobacteriaceae species NOT DETECTED NOT DETECTED Final   Enterobacter cloacae complex NOT DETECTED NOT DETECTED Final   Escherichia coli NOT DETECTED NOT DETECTED Final   Klebsiella oxytoca NOT DETECTED NOT DETECTED Final   Klebsiella pneumoniae NOT DETECTED NOT DETECTED Final   Proteus species NOT DETECTED NOT DETECTED Final   Serratia marcescens NOT DETECTED NOT DETECTED Final   Haemophilus influenzae DETECTED (A) NOT DETECTED Final    Comment: CRITICAL RESULT CALLED TO, READ BACK BY AND VERIFIED WITH: NATE COOKSON ON 01/23/16 AT 0100 BY TLB    Neisseria meningitidis NOT DETECTED NOT DETECTED Final   Pseudomonas aeruginosa NOT DETECTED NOT DETECTED Final   Candida albicans NOT DETECTED NOT DETECTED Final   Candida glabrata NOT DETECTED NOT DETECTED Final   Candida krusei NOT DETECTED NOT DETECTED Final   Candida parapsilosis NOT DETECTED NOT DETECTED Final   Candida tropicalis NOT DETECTED NOT DETECTED Final  Urine culture     Status: Abnormal   Collection Time: 01/21/16  7:19 PM  Result Value Ref Range Status   Specimen Description URINE, RANDOM  Final   Special Requests NONE  Final   Culture >=100,000 COLONIES/mL ESCHERICHIA COLI (A)  Final   Report Status 01/24/2016 FINAL  Final   Organism ID, Bacteria ESCHERICHIA COLI (A)  Final      Susceptibility   Escherichia coli - MIC*    AMPICILLIN <=2 SENSITIVE Sensitive     CEFAZOLIN <=4 SENSITIVE Sensitive     CEFTRIAXONE <=1 SENSITIVE Sensitive     CIPROFLOXACIN <=0.25 SENSITIVE Sensitive     GENTAMICIN <=1 SENSITIVE Sensitive     IMIPENEM <=0.25 SENSITIVE Sensitive     NITROFURANTOIN <=16 SENSITIVE Sensitive     TRIMETH/SULFA <=20 SENSITIVE Sensitive     AMPICILLIN/SULBACTAM <=2 SENSITIVE Sensitive     PIP/TAZO <=4 SENSITIVE Sensitive     Extended ESBL  NEGATIVE Sensitive     * >=100,000 COLONIES/mL ESCHERICHIA COLI  MRSA PCR Screening     Status: None   Collection Time: 01/22/16  2:07 AM  Result Value Ref Range Status   MRSA by PCR NEGATIVE NEGATIVE Final  Comment:        The GeneXpert MRSA Assay (FDA approved for NASAL specimens only), is one component of a comprehensive MRSA colonization surveillance program. It is not intended to diagnose MRSA infection nor to guide or monitor treatment for MRSA infections.     RADIOLOGY:  Dg Chest 1 View  Result Date: 01/24/2016 CLINICAL DATA:  Shortness of breath. EXAM: CHEST 1 VIEW COMPARISON:  01/21/2016.  06/12/2009. FINDINGS: Mediastinum and hilar structures normal. Heart size normal. Interim near complete clearing of left upper lobe infiltrate. Mild left base infiltrate cannot be excluded. Small left pleural effusion . right costophrenic angle not imaged. Stable biapical pleural thickening consistent with scarring. IMPRESSION: 1.  Interim near complete clearing of left upper lobe infiltrate. 2. Mild left lower lobe infiltrate and small left pleural effusion. 3. Stable biapical pleuroparenchymal thickening consistent with scarring . Electronically Signed   By: Maisie Fushomas  Register   On: 01/24/2016 06:42      Management plans discussed with the patient, family and they are in agreement.  CODE STATUS:     Code Status Orders        Start     Ordered   01/24/16 1418  Do not attempt resuscitation (DNR)  Continuous    Question Answer Comment  In the event of cardiac or respiratory ARREST Do not call a "code blue"   In the event of cardiac or respiratory ARREST Do not perform Intubation, CPR, defibrillation or ACLS   In the event of cardiac or respiratory ARREST Use medication by any route, position, wound care, and other measures to relive pain and suffering. May use oxygen, suction and manual treatment of airway obstruction as needed for comfort.      01/24/16 1418    Code Status  History    Date Active Date Inactive Code Status Order ID Comments User Context   01/21/2016 11:10 PM 01/24/2016  2:18 PM DNR [id number]  Oralia Manisavid Willis, MD Inpatient    Advance Directive Documentation   Flowsheet Row Most Recent Value  Type of Advance Directive  Healthcare Power of Attorney, Out of facility DNR (pink MOST or yellow form) Jalene Mullet[Sharryl Gilliam, daughter]  Pre-existing out of facility DNR order (yellow form or pink MOST form)  Yellow form placed in chart (order not valid for inpatient use)  "MOST" Form in Place?  No data      TOTAL TIME TAKING CARE OF THIS PATIENT: 40 minutes.    Houston SirenSAINANI,Jovonta Levit J M.D on 01/25/2016 at 2:09 PM  Between 7am to 6pm - Pager - [phone]  After 6pm go to [email or web address] - Social research officer, governmentpassword [email or web address]  [facility] [place] [facility]  Office  [phone]  CC: Primary care physician; Lauro RegulusANDERSON,MARSHALL W., MD

## 2016-01-25 NOTE — Progress Notes (Signed)
Hospice home bed now available. Writer spoke with patient's daughter's and reassessed patient. Family in agreement with transport. Hospital care team all aware. Discharge summary faxed to referral, report called to the hospice home, EMS notified for transport. Signed DNR in place in discharge packet. Thank you.  Dayna BarkerKaren Robertson RN, BSN, [facility] [place], [facility] Lia[facility] [phone] c

## 2016-01-26 LAB — CULTURE, BLOOD (ROUTINE X 2): CULTURE: NO GROWTH

## 2016-02-20 LAB — CULTURE, BLOOD (ROUTINE X 2)

## 2016-02-21 DEATH — deceased

## 2017-04-17 IMAGING — CT CT HEAD W/O CM
3 series · 15 of 47 positions shown, 18 images · non-contrast
Comparison: None.

CLINICAL DATA: Altered mental status.

EXAM:
CT HEAD WITHOUT CONTRAST
TECHNIQUE: Contiguous axial images were obtained from the base of the skull
through the vertex without intravenous contrast.

[Series 2: head wo · axial · 0.40mm/px · z∈[-81,+49]mm · 9 of 32 slices shown, 12 images]
[im 3/32  brain]
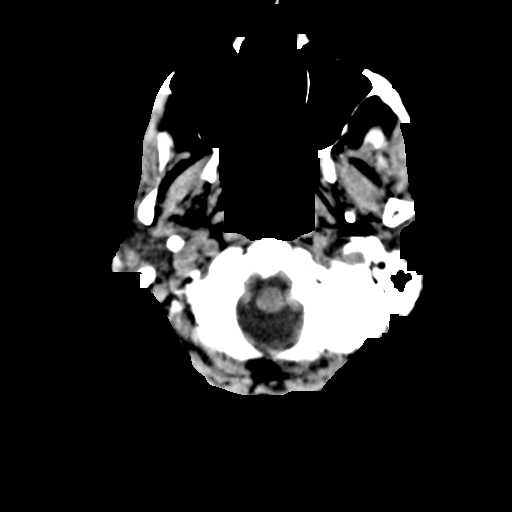
[im 3/32  bone]
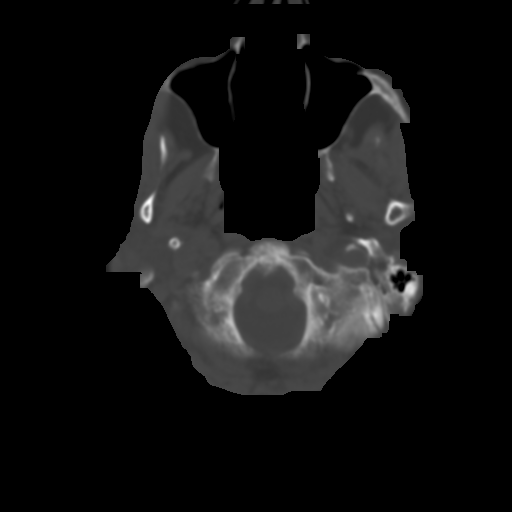
[im 6/32  brain]
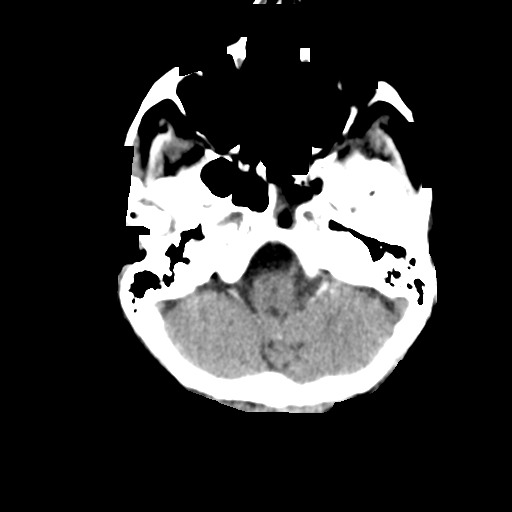
[im 9/32  brain]
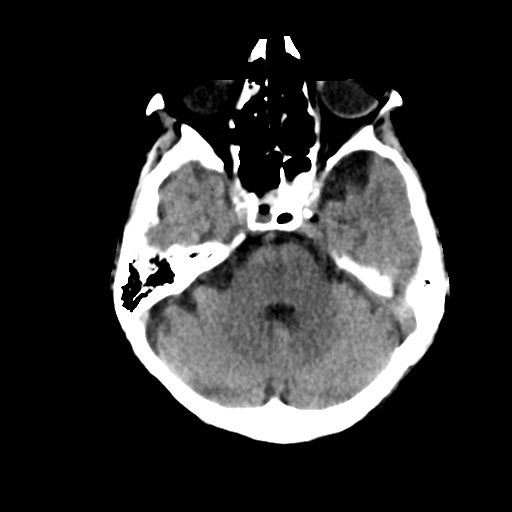
[im 12/32  brain]
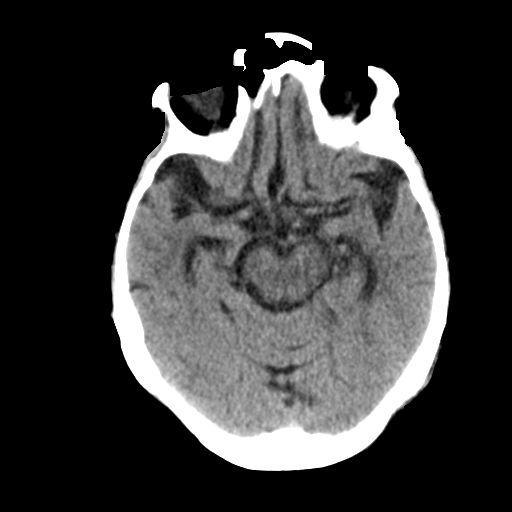
[im 17/32  brain]
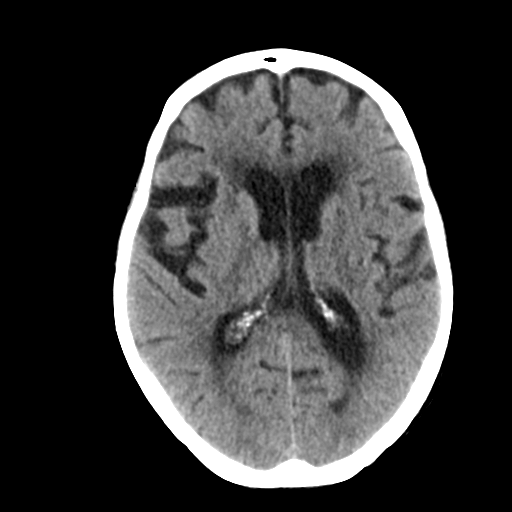
[im 17/32  bone]
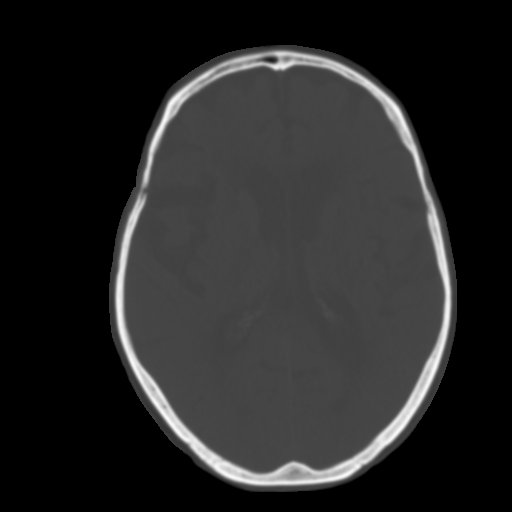
[im 20/32  brain]
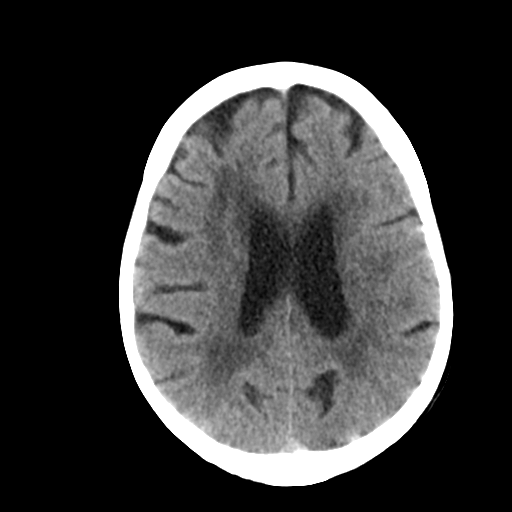
[im 23/32  brain]
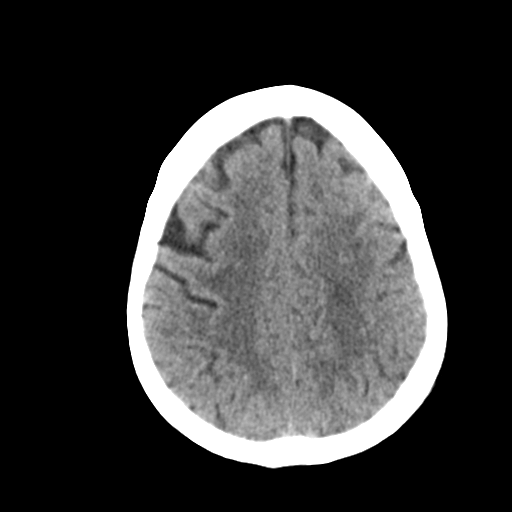
[im 26/32  brain]
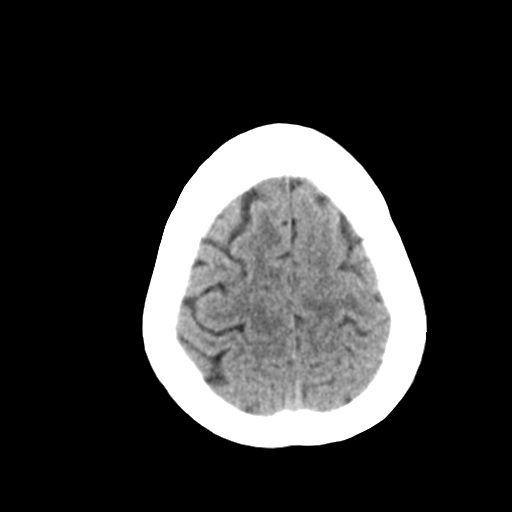
[im 29/32  brain]
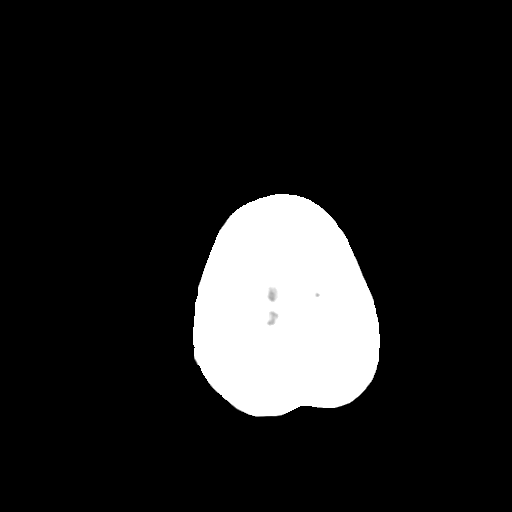
[im 29/32  bone]
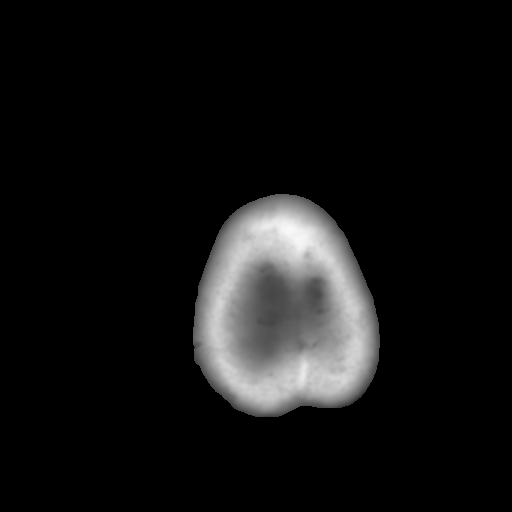

[Series 4: coronal soft tissue · coronal · 0.32mm/px · 3 of 58 slices shown]
[im 21/58  brain]
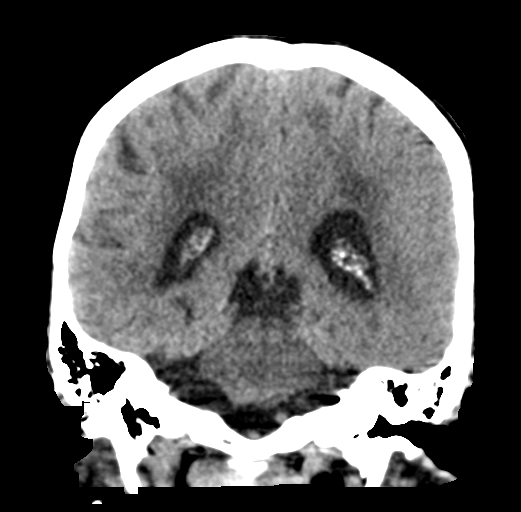
[im 26/58  brain]
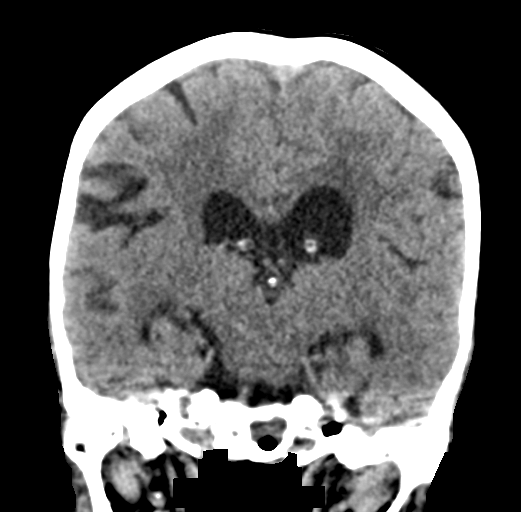
[im 32/58  brain]
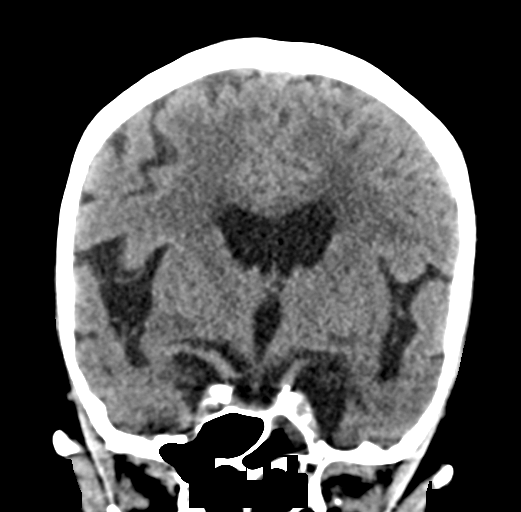

[Series 5: sagittal soft tissue · sagittal · 0.32mm/px · 3 of 48 slices shown]
[im 16/48  brain]
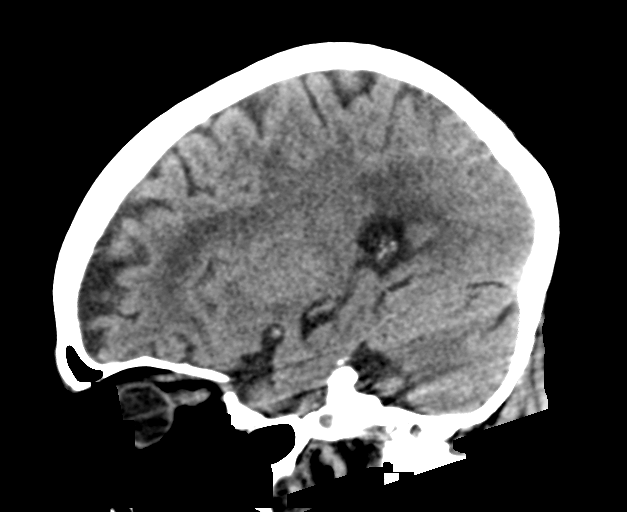
[im 24/48  brain]
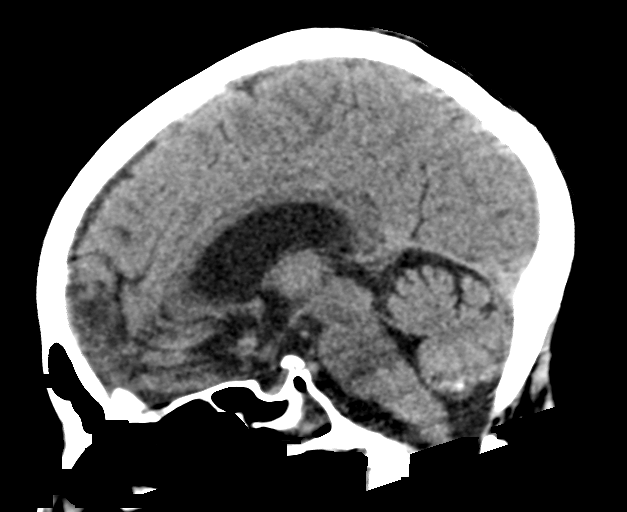
[im 32/48  brain]
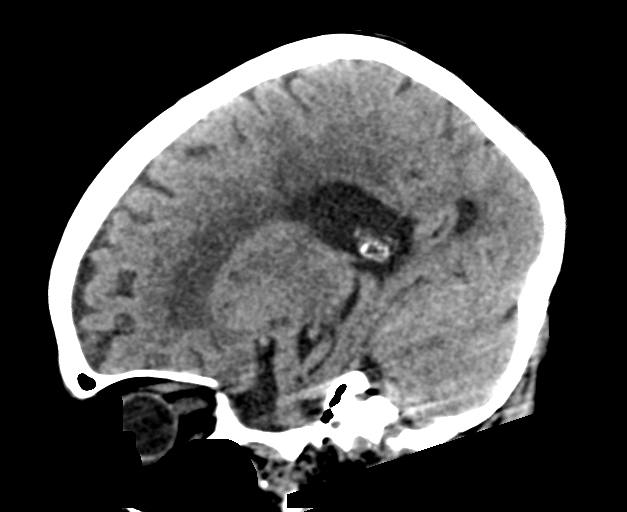

[15 of 47 positions shown; findings below may reference images not displayed]

FINDINGS: BRAIN: There is sulcal and ventricular prominence consistent with
superficial and central atrophy. No intraparenchymal hemorrhage, is
a moderate degree of periventricular and subcortical white matter
hypodensity consistent with chronic small vessel ischemic disease.
Tiny bilateral basal ganglial and left caudate lacunar infarcts
likely given atherosclerotic disease, but in the absence of priors,
are age indeterminate. No acute large vascular territory infarcts.
No abnormal extra-axial fluid collections. Basal cisterns are
patent.

VASCULAR: Mild-to-moderate calcific atherosclerosis of the carotid
siphons.

SKULL: No skull fracture. No significant scalp soft tissue swelling.

SINUSES/ORBITS: The mastoid air-cells are clear. The included
paranasal sinuses demonstrate mild ethmoid and moderate sphenoid
sinus mucosal thickening with mucous noted on the left. The
maxillary sinuses are clear as is the frontal sinus.The included
ocular globes and orbital contents are non-suspicious. Right ocular
lens surgery.

OTHER: None.
IMPRESSION: Age indeterminate in the absence of priors, but likely remote, is a
moderate degree of periventricular and subcortical white matter
small vessel ischemia.

Bilateral tiny basal ganglial and left caudate lacunar infarcts also
likely chronic.

## 2017-04-20 IMAGING — DX DG CHEST 1V
1 series · 1 of 1 positions shown · non-contrast
Comparison: 01/21/2016.  06/12/2009.

CLINICAL DATA: Shortness of breath.

EXAM:
CHEST 1 VIEW

[chest ap]
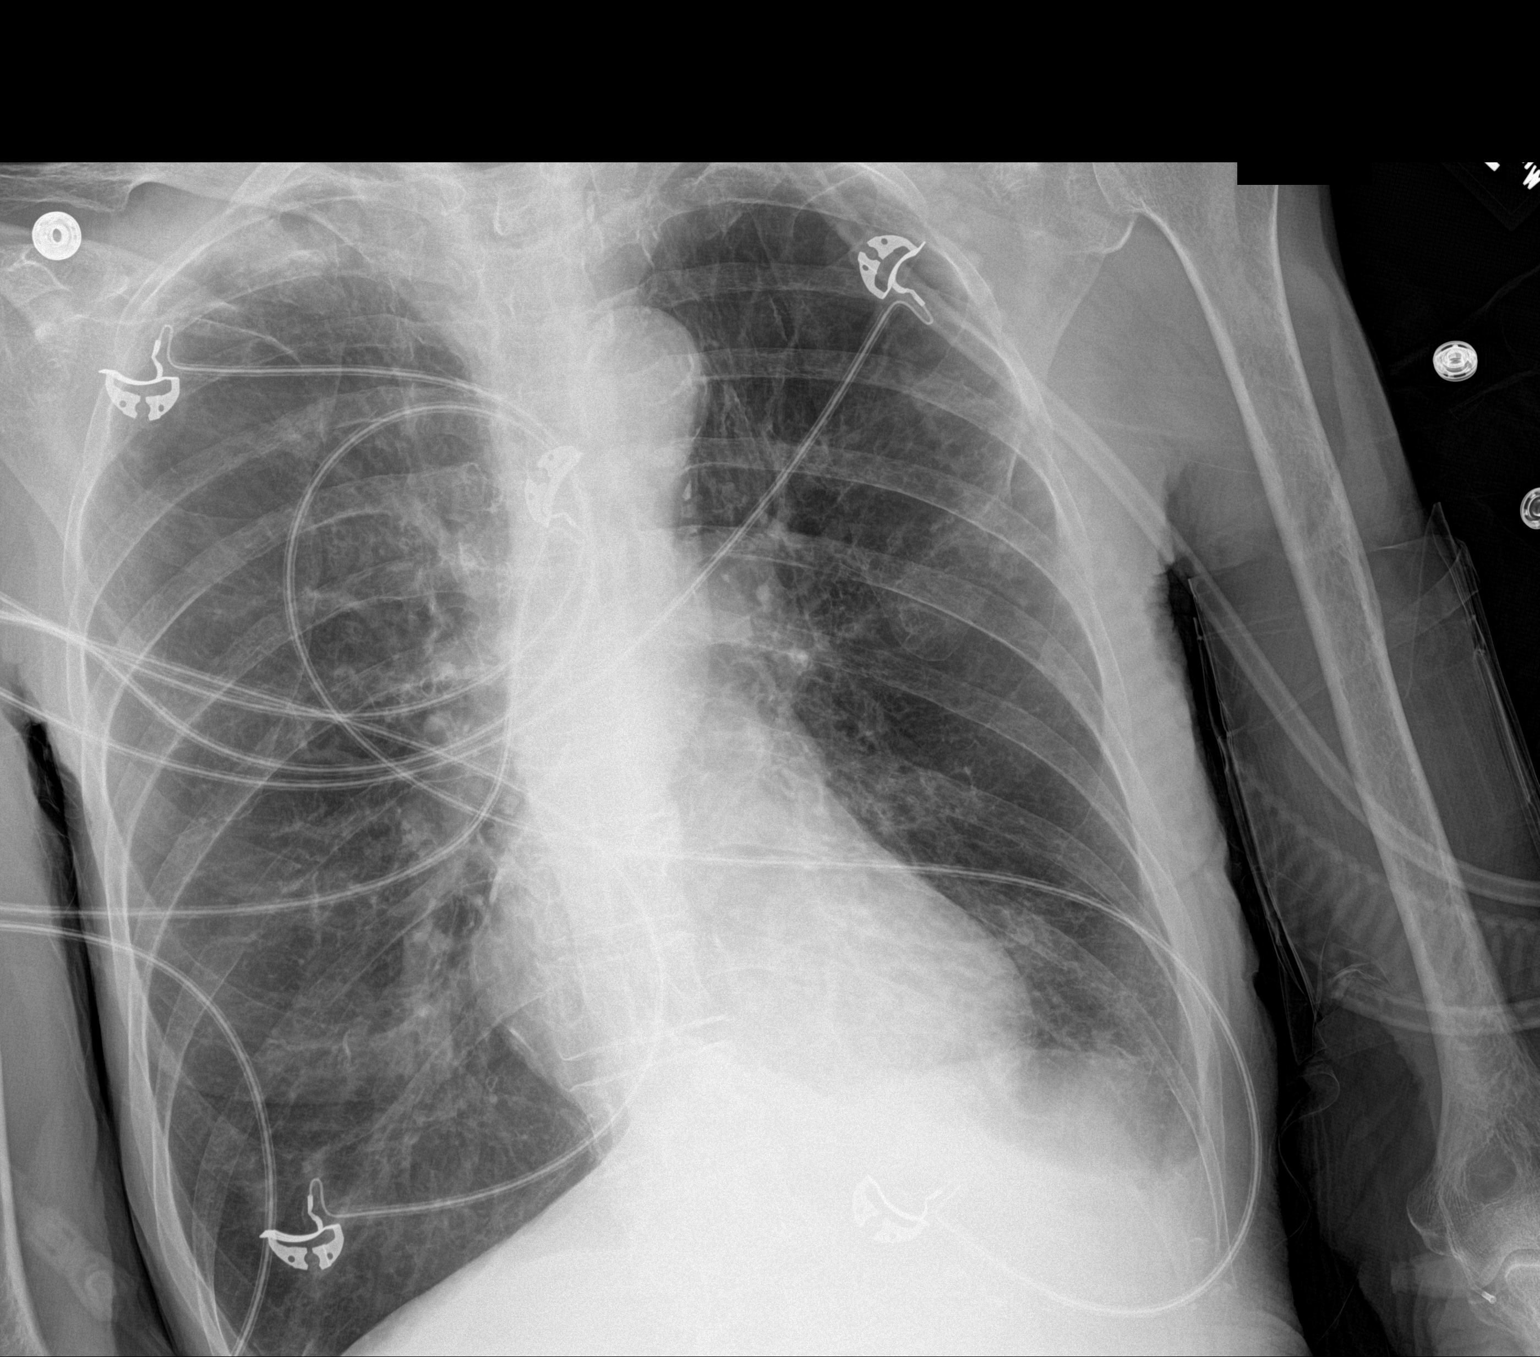

[1 of 1 positions shown; findings below may reference images not displayed]

FINDINGS: Mediastinum and hilar structures normal. Heart size normal. Interim
near complete clearing of left upper lobe infiltrate. Mild left base
infiltrate cannot be excluded. Small left pleural effusion . right
costophrenic angle not imaged. Stable biapical pleural thickening
consistent with scarring.
IMPRESSION: 1.  Interim near complete clearing of left upper lobe infiltrate.

2. Mild left lower lobe infiltrate and small left pleural effusion.

3. Stable biapical pleuroparenchymal thickening consistent with
scarring .
